# Patient Record
Sex: Male | Born: 1940
Health system: Southern US, Community
[De-identification: ages and names within clinical notes are randomized; demographics above are authoritative.]

## PROBLEM LIST (undated history)

## (undated) DIAGNOSIS — I2510 Atherosclerotic heart disease of native coronary artery without angina pectoris: Secondary | ICD-10-CM

---

## 2007-11-20 DIAGNOSIS — I2510 Atherosclerotic heart disease of native coronary artery without angina pectoris: Secondary | ICD-10-CM

## 2007-11-20 HISTORY — PX: CORONARY ARTERY BYPASS GRAFT: SHX141

## 2007-11-20 HISTORY — DX: Atherosclerotic heart disease of native coronary artery without angina pectoris: I25.10

## 2008-05-01 ENCOUNTER — Inpatient Hospital Stay (HOSPITAL_COMMUNITY): Admission: EM | Admit: 2008-05-01 | Discharge: 2008-05-09 | Payer: Self-pay | Admitting: Emergency Medicine

## 2008-05-03 ENCOUNTER — Encounter: Admission: RE | Admit: 2008-05-03 | Discharge: 2008-05-03 | Payer: Self-pay | Admitting: Surgery

## 2008-05-03 ENCOUNTER — Encounter (INDEPENDENT_AMBULATORY_CARE_PROVIDER_SITE_OTHER): Payer: Self-pay | Admitting: Internal Medicine

## 2008-05-03 HISTORY — PX: TRANSTHORACIC ECHOCARDIOGRAM: SHX275

## 2008-05-04 ENCOUNTER — Encounter (INDEPENDENT_AMBULATORY_CARE_PROVIDER_SITE_OTHER): Payer: Self-pay | Admitting: Cardiovascular Disease

## 2008-05-04 HISTORY — PX: OTHER SURGICAL HISTORY: SHX169

## 2008-05-11 ENCOUNTER — Ambulatory Visit: Payer: Self-pay | Admitting: Surgery

## 2008-05-14 ENCOUNTER — Ambulatory Visit: Payer: Self-pay | Admitting: Thoracic Surgery (Cardiothoracic Vascular Surgery)

## 2008-05-25 ENCOUNTER — Encounter: Admission: RE | Admit: 2008-05-25 | Discharge: 2008-05-25 | Payer: Self-pay | Admitting: Surgery

## 2008-05-25 ENCOUNTER — Ambulatory Visit: Payer: Self-pay | Admitting: Surgery

## 2008-05-27 ENCOUNTER — Encounter (HOSPITAL_COMMUNITY): Admission: RE | Admit: 2008-05-27 | Discharge: 2008-08-25 | Payer: Self-pay | Admitting: Cardiology

## 2008-06-01 ENCOUNTER — Ambulatory Visit: Payer: Self-pay | Admitting: Cardiology

## 2008-08-24 ENCOUNTER — Ambulatory Visit: Payer: Self-pay | Admitting: Cardiology

## 2008-08-26 ENCOUNTER — Encounter (HOSPITAL_COMMUNITY): Admission: RE | Admit: 2008-08-26 | Discharge: 2008-10-28 | Payer: Self-pay | Admitting: Cardiology

## 2008-12-21 ENCOUNTER — Ambulatory Visit: Payer: Self-pay | Admitting: Cardiology

## 2009-04-28 ENCOUNTER — Ambulatory Visit: Payer: Self-pay | Admitting: Cardiology

## 2009-08-24 HISTORY — PX: NM MYOCAR PERF EJECTION FRACTION: HXRAD630

## 2009-08-24 HISTORY — PX: TRANSTHORACIC ECHOCARDIOGRAM: SHX275

## 2009-10-21 ENCOUNTER — Encounter (INDEPENDENT_AMBULATORY_CARE_PROVIDER_SITE_OTHER): Payer: Self-pay | Admitting: *Deleted

## 2009-10-27 ENCOUNTER — Ambulatory Visit: Payer: Self-pay | Admitting: Cardiology

## 2009-12-26 ENCOUNTER — Ambulatory Visit: Payer: Self-pay | Admitting: Cardiology

## 2010-02-14 ENCOUNTER — Encounter (INDEPENDENT_AMBULATORY_CARE_PROVIDER_SITE_OTHER): Payer: Self-pay | Admitting: *Deleted

## 2010-12-21 NOTE — Miscellaneous (Signed)
  Clinical Lists Changes  Observations: Added new observation of RS STUDY: TRACER - study completion 12/26/09 (02/14/2010 11:41)      Research Study Name: TRACER - study completion 12/26/09

## 2010-12-21 NOTE — Miscellaneous (Signed)
  Clinical Lists Changes  Observations: Added new observation of RS STUDY: TRACER (10/21/2009 12:55)      Research Study Name: TRACER

## 2011-04-03 NOTE — Op Note (Signed)
NAME:  Carlos [name], SCIANDRA NO.:  [id number]   MEDICAL RECORD NO.:  [id number]          PATIENT TYPE:  INP   LOCATION:  2313                         FACILITY:  [facility]   PHYSICIAN:  Evelene Croon, M.D.     DATE OF BIRTH:  03/03/1941   DATE OF PROCEDURE:  05/05/2008  DATE OF DISCHARGE:                               OPERATIVE REPORT   PREOPERATIVE DIAGNOSIS:  Left main and severe three-vessel coronary  disease.   POSTOPERATIVE DIAGNOSIS:  Left main and severe three-vessel coronary  disease.   OPERATIVE PROCEDURE:  Median sternotomy, extracorporeal circulation,  coronary bypass graft surgery x5 using a left internal mammary artery  graft to left anterior descending coronary artery, with a saphenous vein  graft to the second diagonal branch of the left anterior descending, a  sequential saphenous vein graft to the first and second obtuse marginal  branches of the left circumflex coronary artery, and a saphenous vein  graft to the posterior descending branch of the right coronary,  endoscopic vein harvesting from both thighs.   ATTENDING SURGEON:  Evelene Croon, MD   ASSISTANT:  Doree Fudge, PA   ANESTHESIA:  General endotracheal.   CLINICAL HISTORY:  This patient is a 70 year old gentleman with a strong  family history of heart disease who was admitted with chest pain and  ruled in for myocardial infarction with a peak CPK of 840 and MB of  100.3.  Troponin-I was 16.55.  Cardiac catheterization revealed diffuse  70% left main narrowing.  The LAD had 99% proximal stenosis before a  second diagonal branch.  The second diagonal itself had significant  proximal stenosis.  The first diagonal was relatively small.  Left  circumflex had 80% ostial stenosis.  There were 2 marginal branches, the  first which had 99% ostial stenosis and the second had 90% ostial  stenosis.  The right coronary artery had 60% ostial stenosis, 70-80%  proximal stenosis and 40% mid stenosis.   Left ventricular ejection  fraction was about 60%.  There is no mitral regurgitation and no  gradient across the aortic valve.  After review of the angiogram and  examination of the patient, it was felt that coronary bypass graft  surgery is the best treatment to prevent further ischemia and  infarction.  I discussed the operative procedure with the patient and  his wife including alternatives, benefits, and risks including, but not  limited to bleeding, blood transfusion, infection, stroke, myocardial  infarction, graft failure, and death.  They understood and agreed to  proceed.   OPERATIVE PROCEDURE:  The patient was taken to the operating room and  placed on table in supine position.  After induction of general  endotracheal anesthesia, a Foley catheter was placed in bladder using  sterile technique.  Then the chest, abdomen, and both lower extremities  were prepped and draped in usual sterile manner.  The chest was entered  through a median sternotomy incision.  The pericardium opened in  midline.  Examination of the heart showed good ventricular  contractility.  The ascending aorta had no palpable plaques in it.  Then the left internal mammary artery was harvested from the chest wall  and pedicle graft.  This is a medium caliber vessel with excellent blood  flow through it.  At same time segment of greater saphenous vein was  harvested from the right leg using endoscopic vein harvest technique.  This vein was of medium size and good quality.  Below the knee, this  vein divided into 3 small sub branches neither which was suitable.  Therefore another section of vein was harvested from the left thigh  using endoscopic vein harvest technique.  This vein was of small to  medium caliber and somewhat thick-walled and less ideal quality than the  right-sided vein.   Then the  patient was heparinized and when an adequate activated  clotting time was achieved, the distal ascending aorta  was cannulated  using a 20-French aortic cannula for arterial inflow.  Venous outflow  was achieved using a 2-stage venous cannula for the right atrial  appendage.  An antegrade cardioplegia and vent cannula was inserted in  aortic root.   The patient placed on cardiopulmonary bypass and distal coronaries  identified.  The LAD was a large graftable vessel.  The mid part of the  vessel had some plaque in it.  The first diagonal branch was a small non  graftable vessel.  It was diffusely diseased.  The second diagonal  branch was a moderate-sized vessel that was also fairly heavily  diseased, but graftable distally.  The 2 marginal branches were both  graftable.  The first was larger and was epicardial while the second was  intramyocardial.  The right coronary was diffusely diseased with  calcific plaque in this extended out into the takeoff of the posterior  descending branch.  Beyond this, the posterior descending was a medium  size graftable vessel.   Then the aorta was crossclamped and 1000 mL of cold blood antegrade  cardioplegia was administered in the aortic root with quick arrest of  the heart.  Systemic hypothermia to 20 degrees centigrade and topical  hypothermic iced saline was used.  A temperature probe placed in septum  and insulating pad in the pericardium.   The first distal anastomosis was performed to the first marginal branch.  The internal diameter was about 1.75 mm.  Conduit used was a segment of  greater saphenous vein.  The anastomosis formed was a sequential side-to-  side manner using continuous 7-0 Prolene suture.  Flow was noted through  the graft was excellent.   The second distal anastomosis was performed to the second marginal  branch.  The internal diameter was about 1.6 mm.  Conduit used was the  same segment of greater saphenous vein the anastomosis formed a  sequential end-to-side manner using continuous 7-0 Prolene suture.  Flow  was noted through the  graft and was excellent.  Then another dose of  cardioplegia was given down the vein graft and the aortic root.   Third distal anastomosis was performed in the second diagonal branch.  The internal diameter was about 1.6 mm.  Conduit used was a second  segment of greater saphenous vein and anastomosis formed end-to-side  manner using continuous 7-0 Prolene suture.  Flow was noted through the  graft was excellent.   The fourth distal anastomosis was performed to the posterior descending  coronary artery.  The internal diameter of this vessel was about 1.75  mm.  Conduit used was a third segment of greater saphenous vein.  The  anastomosis  performed in a end-to-side manner using continuous 7-0  Prolene suture.  Flow was noted through the graft was excellent.   The fifth distal anastomosis was performed to distal LAD.  The internal  diameter was 2 mm.  The conduit used was a left internal mammary graft  and was brought through an opening left pericardium anterior to the  phrenic nerve.  It was anastomosed to the LAD in end-to-side manner  using continuous 8-0 Prolene suture.  The pedicle was sutured to the  epicardium with 6-0 Prolene sutures.  The patient rewarmed to 37 degrees  centigrade.  With the crossclamp in place, the 3 proximal vein graft  anastomoses were performed to the aortic root in an end-to-side manner  using continuous 6-0 Prolene suture.  Then the clamp removed from the  mammary pedicle.  There is rapid warming of the ventricular septum and  return of spontaneous ventricular fibrillation.  Crossclamp removed time  92 minutes and the patient spontaneously converted to sinus rhythm.  The  proximal and distal anastomoses appeared hemostatic while the grafts  satisfactory.  Graft markers placed around the proximal anastomoses.  Two temporary right ventricular and right atrial pacing wires placed  above the skin.   When the patient rewarmed to 37 degrees centigrade, he was  weaned from  cardiopulmonary bypass on no inotropic trophic agents.  Total bypass  time was 110 minutes.  Cardiac function appeared excellent with a  cardiac output of 7 liters per minute.  Protamine was given and venous  and aortic cannulas were removed without difficulty.  Hemostasis was  achieved.  The patient was given 1 unit of platelets due to platelet  count 75,000.  Then 3 chest tubes were placed with 2 in the post  pericardium, 1 in the anterior mediastinum,  and 1 in the left pleural  space.  The sternum was then closed with double #6 stainless steel  wires.  The fascia was closed with continuous #1 Vicryl suture.  Subcutaneous tissue was closed with continuous 2-0 Vicryl and skin with  3-0 Vicryl subcuticular closure.  Lower extremity vein harvest site was  closed in layers in a similar manner.  The sponge, needle, and  instrument counts were correct according to scrub nurse.  Dry sterile  dressing was applied over the incisions  around the chest tubes with  Pleur-Evac suction.  The patient remained hemodynamically stable, and  transferred to the SICU in guarded, but stable condition.      Evelene Croon, M.D.  Electronically Signed     BB/MEDQ  D:  05/05/2008  T:  05/06/2008  Job:  [id number]   cc:   Cristy Hilts. Jacinto Halim, MD

## 2011-04-03 NOTE — H&P (Signed)
NAME:  Carlos [name], LANGLAND NO.:  [id number]   MEDICAL RECORD NO.:  [id number]          PATIENT TYPE:  INP   LOCATION:  3701                         FACILITY:  [facility]   PHYSICIAN:  Lonia Blood, M.D.      DATE OF BIRTH:  May 01, 1941   DATE OF ADMISSION:  05/01/2008  DATE OF DISCHARGE:                              HISTORY & PHYSICAL   PRIMARY CARE PHYSICIAN:  The patient is unassigned.   PRESENTING COMPLAINT:  Left-sided chest pain.   HISTORY OF PRESENT ILLNESS:  The patient is a 70 year old gentleman with  no significant past medical history, brought in by his wife secondary to  onset of chest pain today.  Chest pain was said to be in the left side  and made discomfort.  It came suddenly and then radiated towards his  left axilla.  It was described as a dull pain 6-7/10.  No associated  diaphoresis.  No shortness of breath.  No fever.  No cough.  Denied any  near syncope, palpitations, or syncope.  The patient has risk factors of  coronary artery disease including some family history, remote tobacco  use, male sex, and unknown cholesterol status.   PAST MEDICAL HISTORY:  None.   MEDICATIONS:  None   ALLERGIES:  No known drug allergies.   REVIEW OF SYSTEMS:  Twelve-point review of system is negative except as  per HPI.   PHYSICAL EXAMINATION:  VITAL SIGNS:  Temperature is 96.7, blood pressure  104/75, pulse 59, and respiratory rate 16.  His sats 95% on room air.  GENERAL:  He is awake, alert, oriented, and pleasant man, in no acute  distress.  HEENT:  PERRL.  EOMI.  NECK:  Supple.  No JVD.  No lymphadenopathy.  RESPIRATORY:  The patient has good air entry bilaterally.  No wheezes.  No rales.  CARDIOVASCULAR SYSTEM:  He has S1 and S2.  No murmurs.  ABDOMEN:  Soft and nontender with positive bowel sounds.  EXTREMITIES:  No edema, cyanosis, or clubbing.   LABORATORY DATA:  Labs showed a white count of 6.9, hemoglobin 14.2, and  platelet count 174.  Sodium 139,  potassium 3.4, chloride 106, CO2 24,  glucose 115, BUN 14, and creatinine 0.99.  Chest x-ray showed linear  lingular scarring or atelectasis.  His EKG showed sinus bradycardia with  a rate of 52.  PR interval is regular with no ST-T wave changes.   ASSESSMENT:  Therefore, this is a 70 year old gentleman presenting with  what appeared to be left-sided chest pain.  The differentials here could  be acute coronary syndrome.  Could be pericarditis.  Could be some viral  illness or musculoskeletal in nature.  It could also be some elements of  acid reflux, although the patient denied any symptoms of such.   PLAN:  Therefore will be:  1. Chest pain.  We will admit the patient to tele bed for rule out MI      as an observation.  We will check serial cardiac enzymes and if      they are negative, we will set up the  patient to have an outpatient      stress test.  2. Hypokalemia.  We will give the patient potassium as a form of      replacement.  3. Unknown cholesterol status.  We will check TSH, fasting lipid      panel, and hemoglobin A1c.  We will also check urine drug screen      for further management.  We will also check 2-D echo and give him      some nitro as needed.  Otherwise, further treatment will depend on      the patient's initial response to treatment.      Lonia Blood, M.D.  Electronically Signed     LG/MEDQ  D:  05/01/2008  T:  05/01/2008  Job:  [id number]

## 2011-04-03 NOTE — Assessment & Plan Note (Signed)
OFFICE VISIT   JUWAN, Carlos [name]  DOB:  May 03, 1941                                        May 25, 2008  CHART #:  [id number]   HISTORY:  The patient returned today for a followup status post coronary  artery bypass graft surgery x5 on May 05, 2008.  His immediate  postoperative course was uncomplicated.  He did return to see Dr.  Orson Aloe on May 14, 2008, due to some early cellulitis in his right  leg saphenous vein harvest incision.  He was started on Keflex 500 mg  p.o. t.i.d. and this has resolved.  He has had no pain and denies any  shortness of breath.   He has been walking about 2 miles per day and is anxious to return to  work part-time.   PHYSICAL EXAMINATION:  VITAL SIGNS:  His blood pressure is 117/79 and  his pulse is 87 and regular.  Respiratory rate is 18 and unlabored.  Oxygen saturation on room air is 96%.  GENERAL:  He looks well.  CARDIAC:  Regular rate and rhythm with normal heart sounds.  LUNGS:  Clear.  CHEST:  The chest incision is healing well and the sternum is stable.  EXTREMITIES:  His right leg vein harvest incision is intact.  There is  some firmness and induration around the incision suggesting some  hematoma within the vein harvest tunnel.  There is no sign of infection  at this point.  There is no peripheral edema.  The left leg vein harvest  incision is healing well.   IMAGING STUDIES:  Followup chest x-ray shows mild left basilar  atelectasis and a tiny amount of pleural fluid.   MEDICATIONS:  1. Toprol-XL 25 mg daily.  2. Lisinopril 2.5 mg daily.  3. Crestor 40 mg nightly.  4. Aspirin 325 mg daily.   IMPRESSION:  Overall, the patient is recovering well following his  surgery.  The right leg vein harvest incision cellulitis appears to have  resolved.  I would expect the induration to gradually resolve.  He feels  well overall.  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  I told him he could return to work part-  time, when he feels up to that.  He will continue to follow up with Dr.  Jacinto Halim and will contact me if he develops any problems with his incision.   Evelene Croon, M.D.  Electronically Signed   BB/MEDQ  D:  05/25/2008  T:  05/26/2008  Job:  [id number]   cc:   Cristy Hilts. Jacinto Halim, MD

## 2011-04-03 NOTE — Consult Note (Signed)
NAME:  Carlos [name], Carlos [name] NO.:  [id number]   MEDICAL RECORD NO.:  [id number]          PATIENT TYPE:  INP   LOCATION:  2907                         FACILITY:  [facility]   PHYSICIAN:  Evelene Croon, M.D.     DATE OF BIRTH:  [birth year]   DATE OF CONSULTATION:  DATE OF DISCHARGE:                                 CONSULTATION   REFERRING PHYSICIAN:  Vonna Kotyk R. Jacinto Halim, MD   REASON FOR CONSULTATION:  Severe three-vessel coronary artery disease  status post non-ST-segment elevation MI.   CLINICAL HISTORY:  I was asked by Dr. Jacinto Halim to evaluate Carlos [name] for  coronary artery bypass graft surgery.  He is a 70 year old gentleman  with a strong family history of heart disease, as well as a remote  history of smoking who presented with about a 2-week history of  intermittent substernal chest discomfort which he described as a burning  sensation.  This has been associated with dyspnea on exertion.  He  thought this was heartburn or ingestion and ultimately resolved.  He was  awoken about midnight on April 30, 2008, with chest discomfort which was  the same burning sensation radiating to the left arm.  He called EMS and  was brought to [facility] Emergency Room.  On oxygen, his chest pain  slightly improved but he said his left arm discomfort persisted for  several hours.  His initial cardiac markers were elevated with a  troponin of 0.93 and a CPK of 316 and an MB of 30.7.  His CPK peaked at  840 with an MB of 100.3.  His peak troponin was 16.5.  He remained  stable and underwent cardiac catheterization yesterday which showed  severe three-vessel disease.  The LAD had 95-99% proximal stenosis.  There are two diagonal branches.  The left main had about 60% diffuse  narrowing.  Left circumflex had 80% proximal stenosis.  There were two  marginal branches one of which had 90% proximal stenosis and the other  85% proximal stenosis.  The right coronary artery was also diffusely  diseased with  60% ostia, 80% proximal and 40% midvessel disease.  Left  ventricular function was normal.  There is no mitral regurgitation.   REVIEW OF SYSTEMS:  GENERAL:  He denies any fever or chills.  He has had  no recent weight changes.  He does report some fatigue.  EYES:  Negative.  ENT:  Negative.  ENDOCRINE:  He denies diabetes and  hypothyroidism.  CARDIOVASCULAR:  As above.  He denies PND and  orthopnea.  He has had no peripheral edema.  Denies palpitations.  RESPIRATORY:  Denies cough and sputum production.  GI:  He has had no  nausea, vomiting.  Denies melena and bright red blood per rectum.  GU:  He denies dysuria and hematuria.  MUSCULOSKELETAL:  Denies arthralgias  and myalgias.  NEUROLOGICAL:  Denies any focal weakness or numbness.  Denies dizziness and syncope.  He has never had a TIA or stroke.  ALLERGIES:  None.  PSYCHIATRIC:  Negative.  HEMATOLOGICAL:  Negative.  VASCULAR:  He denies claudication or phlebitis.  PAST MEDICAL HISTORY:  1. Remote tonsillectomy  2. Denies any history of hypertension or diabetes.  3. Denies hyperlipidemia.   He has not seen a physician in many years.   MEDICATIONS:  None.   ALLERGIES:  None.   SOCIAL HISTORY:  He is married and lives with his wife.  He quit smoking  about 6-7 years ago with about 10-12 pack year smoking history.  He  continues to work full-time for [employer].   FAMILY HISTORY:  Strongly positive for premature coronary artery  disease.  His son had a large myocardial infarction at age 21 and is  present at 41.   PHYSICAL EXAMINATION:  VITAL SIGNS:  His blood pressure is 120/73, and  pulse is 63 and regular.  Respiratory rate is 16 unlabored.  GENERAL:  He is a well-developed obese white male in no distress.  HEENT:  Shows him to be normocephalic and atraumatic.  Pupils are equal  and reactive to light and accommodation.  His extraocular muscles are  intact.  His throat is clear.  NECK:  Shows normal carotid pulses bilaterally.   There are no bruits.  There is no adenopathy or thyromegaly.  CARDIAC:  Shows a regular rate and rhythm with normal S1-S2.  There is  no murmur, rub or gallop.  LUNGS:  Clear.  ABDOMEN:  Shows active bowel sounds.  His abdomen is soft, obese and  nontender.  There are no palpable masses or organomegaly.  EXTREMITY:  Shows no peripheral edema.  Pedal pulses are palpable  bilaterally.  SKIN:  Warm and dry.  NEUROLOGIC:  Shows him to be alert and oriented x3.  Motor and sensory  exams are grossly normal.   Electrocardiogram shows normal sinus rhythm with anterior T-wave  abnormality.  Carotid Doppler examination is pending.  Hemoglobin was  14.2 on admission, platelet count 174,000.  Electrolytes were normal  with a BUN of 14 and creatinine of 0.99.   IMPRESSION:  Carlos [name] has severe three-vessel coronary artery disease  status post acute non-ST segment elevation MI.  I agree that coronary  artery bypass graft surgery is best treatment to prevent further  ischemia, and  infarction.  He was started on the tracer study and  received a loading dose prior to his catheterization.  He is currently  on Integrilin.  I do not think he should be continued on the tracer  study since he is going to require coronary bypass surgery in the  morning.  We will plan to stop his Integrilin 6 hours preoperatively.  I  discussed the operative procedure of coronary bypass surgery with him  including alternatives, benefits, and risks including but not limited to  bleeding, blood transfusion, infection, stroke, myocardial infarction,  graft failure, and death.  He understands and agrees to proceed.      Evelene Croon, M.D.  Electronically Signed     BB/MEDQ  D:  05/04/2008  T:  05/05/2008  Job:  [id number]   cc:   Cristy Hilts. Jacinto Halim, MD

## 2011-04-03 NOTE — Assessment & Plan Note (Signed)
OFFICE VISIT   Carlos [name], Carlos [name]  DOB:  October 29, 1941                                        May 14, 2008  CHART #:  [id number]   The patient is a 70 year old gentleman who had coronary bypass grafting  by Dr. Laneta Simmers on May 05, 2008.  His postoperative course was  uncomplicated.  This morning, he noticed that on his right leg where the  saphenous vein was harvested, there is an incision above the knee that  was more swollen and red and warm.  He has not had any fevers, chills,  or sweats.  He has not had any calf swelling or tenderness.   PHYSICAL EXAMINATION:  VITAL SIGNS:  Blood pressure is 107/69, pulse 88,  respirations were 18, and his oxygen saturation is 95% on room air.  GENERAL:  He is a 70 year old white male in no acute distress.  He is  afebrile.  CHEST:  The sternal incision, chest tube sites, and right leg incisions  are all healing well.  The incision just below the knee on the right  leg, there is seroma.  There is also redness, induration, and warmth and  very slight tenderness consistent with early cellulitis.   IMPRESSION:  Early cellulitis, right leg wound.  We will start on Keflex  500 mg p.o. t.i.d.  The patient was instructed to call back if there is  any fevers greater than 101.5 or increase in the redness or tenderness  or any purulent drainage.  We will have him come back to see Dr. Laneta Simmers  next week.   Salvatore Decent Dorris Fetch, M.D.  Electronically Signed   SCH/MEDQ  D:  05/14/2008  T:  05/15/2008  Job:  [id number]

## 2011-04-03 NOTE — Consult Note (Signed)
NAME:  QASIM, DIVELEY NO.:  [id number]   MEDICAL RECORD NO.:  [id number]          PATIENT TYPE:  INP   LOCATION:  3701                         FACILITY:  [facility]   PHYSICIAN:  Cristy Hilts. Jacinto Halim, MD       DATE OF BIRTH:  [birth year]   DATE OF CONSULTATION:  05/01/2008  DATE OF DISCHARGE:                                 CONSULTATION   REFERRING PHYSICIAN:  Dr. Sherrilee Gilles.   REASON FOR CONSULTATION:  Please evaluate for non-Q-wave myocardial  infarction and unstable angina.   CHIEF COMPLAINT:  Mr. Rainville is a 70 year old pleasant gentleman who  had been complaining of chest pain that started last night around 11:30  to 12 o'clock.  This chest discomfort was in the form of burning  sensation with radiation to his left arm and hence immediately within 10  minutes of waiting, he called the EMS, and he was brought to the [facility] Emergency Room.  After placing him, the patient on oxygen, his  chest discomfort had significantly improved; however, left arm pain  discomfort was still persistent for about 2-3 hours.  His cardiac  markers were positive for myocardial infarction hence I was called for  evaluation.  Presently, he is completely asymptomatic.   On further questioning, the patient state that he has been noticing  chest tightness in the form of burning sensation in the chest associated  with dyspnea on exertion for the last 2-3 weeks.  He attributed this to  indigestion and moved on.   REVIEW OF SYSTEMS:  He denies any bowel or bladder dysfunction.  Denies  neurological weakness.  Denies recent weight gain or weight loss.  Other  systems are negative.   HOME MEDICATIONS:  None.   ALLERGIES:  No known drug allergies.   PAST MEDICAL HISTORY:  No history of hypertension and diabetes.  He does  not know anything about his lipids, not seen a physician in many years.   PAST SURGICAL HISTORY:  Remote appendectomy.   SOCIAL HISTORY:  He is married, lives with his  wife, quit smoking about  6-7 years ago.  He has approximately about 10-12 pack year history of  smoking in the past.   FAMILY HISTORY:  Strongly positive for premature coronary artery  disease.  His son had myocardial infarction at the age of 45.  He is  presently 70 years of age.  He has a daughter who is 22 years of age  alive and healthy.  He has a sister who is 32 years of age alive and  healthy and half-sister who is in 58s alive and healthy.   PHYSICAL EXAMINATION:  GENERAL:  He is well-built who appears to be in  no acute distress.  VITAL SIGNS:  Temperature of 98.7, pulse is 68 beats per minute,  respiration is 14, and blood pressure 128/83 mmHg.  CARDIAC:  S1 and S2 normal.  There is no gallop or murmur.  CHEST:  Bilaterally clear breath sounds.  No crackles.  ABDOMEN:  Soft.  EXTREMITIES:  No edema.  Peripheral vascular exam was normal.  LABORATORY DATA:  His CBC was within normal limits.  BMP was within  normal limits except for potassium of 3.4, glucose was minimally  elevated at 115, and his cardiac markers were clearly positive for  myocardial injury with a total CK of 316 and MB of 30.7.  Troponin of  0.93 at 4:30 this morning.   His admission EKG in the emergency room reveals normal sinus rhythm and  there is suggestion of minimal ST elevation or hyperacute T-waves  anteriorly.  However, repeat EKG done just a few minutes ago at 11:23  reveals completely normal sinus rhythm without any evidence of ischemia.   IMPRESSION:  1. Non-ST elevation myocardial infarction.  2. Unknown lipid status.  3. Strong family history of premature coronary artery disease.   RECOMMENDATIONS:  I discussed to the patient regarding proceeding with  cardiac catheterization on elective fashion.  He understands less than  1% risk or death stroke, heart attack, and need for urgent bypass  surgery with cardiac catheterization on 2-3% with PCI.  I am going to  start him on instead of  metoprolol, I will start him on Coreg and also  Coreg 3.125 mg p.o. b.i.d.  Add Crestor 40 mg p.o. daily and Integrilin  for pharmacy.  Unless, he develops recurrent chest pain or any EKG  changes, I will take him to the catheterization lab on a urgent basis.   I thank Dr. Sherrilee Gilles for asking me to see this pleasant gentleman.      Cristy Hilts. Jacinto Halim, MD  Electronically Signed     JRG/MEDQ  D:  05/01/2008  T:  05/02/2008  Job:  [id number]

## 2011-04-03 NOTE — Discharge Summary (Signed)
NAME:  Carlos [name], Carlos [name] NO.:  [id number]   MEDICAL RECORD NO.:  [id number]          PATIENT TYPE:  INP   LOCATION:  2012                         FACILITY:  [facility]   PHYSICIAN:  Evelene Croon, M.D.     DATE OF BIRTH:  11-30-1940   DATE OF ADMISSION:  05/01/2008  DATE OF DISCHARGE:  05/09/2008                               DISCHARGE SUMMARY   FINAL DIAGNOSIS:  Left main and severe three-vessel coronary artery  disease.   IN-HOSPITAL DIAGNOSES:  1. Low volume overload postoperatively.  2. Acute blood loss anemia postoperatively.   SECONDARY DIAGNOSES:  1. Status post tonsillectomy.  2. History of tobacco use, but he quit smoking about 6-7 years ago.   IN-HOSPITAL OPERATIONS AND PROCEDURES:  1. Cardiac catheterization.  2. Coronary artery bypass grafting x5 using left internal mammary      artery graft to left anterior descending coronary artery, saphenous      vein graft to second diagonal branch of left anterior descending,      sequential saphenous vein graft to first and second obtuse marginal      branches of the left circumflex coronary artery, and saphenous vein      graft to posterior descending branch of the right coronary artery.      Endoscopic vein harvest from both thighs was done.   HISTORY AND PHYSICAL AND HOSPITAL COURSE:  The patient is a 70 year old  gentleman with a strong family history of heart disease, who was  admitted with chest pain and ruled in for myocardial infarction with a  peak CPK of 840 and MB of 100.3.  Troponin I was elevated at 16.55.  The  patient was taken for cardiac catheterization, which revealed severe  three-vessel coronary artery disease.  Following catheterization, Dr.  Laneta Simmers was consulted.  Dr. Laneta Simmers discussed with the patient undergoing  coronary artery bypass grafting.  He discussed risks and benefits with  the patient.  The patient acknowledged understanding and agreed to  proceed.  Surgery was scheduled for May 05, 2008.  Preoperatively, the  patient underwent bilateral carotid duplex ultrasound showing no  significant ICA stenosis.  The patient remained stable preoperatively.   The patient was taken to the operating room on May 05, 2008, where he  underwent coronary artery bypass grafting x5 using a left internal  mammary artery graft to left anterior descending coronary artery,  saphenous vein graft to second diagonal branch of the left anterior  descending, sequential saphenous vein graft to first and second obtuse  marginal branches of the left circumflex coronary artery, and saphenous  vein graft to posterior descending branch of the right coronary artery.  Endoscopic vein harvesting from bilateral thighs was done.  The patient  tolerated this procedure well and was transferred to the Intensive Care  Unit in stable condition.  Postoperatively, the patient was noted to be  hemodynamically stable.  He was extubated on evening of surgery.  Post-  extubation, he was noted to be alert and oriented x4.  Neuro intact.  The patient's postoperative course was pretty much unremarkable.  Chest  x-ray  done postop day 1 was stable.  The patient had minimal drainage  from chest tubes and chest tubes discontinued in normal fashion.  The  patient was able to be weaned off all drips and blood pressure and heart  rate are stable following.  He was noted to be in normal sinus rhythm  postoperatively.  The patient was started on low-dose beta-blocker.  He  was progressing well in the Intensive Care Unit and felt to be stable  for transfer out to 2000 on postop day 2.  The patient continued to work  on his incentive spirometer and he was able to be weaned off oxygen  sating greater than 90% on room air.  He remained in normal sinus  rhythm.  A low-dose ACE inhibitor was added for a blood pressure  control.  Blood pressure did improve.  The patient did have mild acute  blood loss anemia postoperatively.  He was  asymptomatic and started on  p.o. iron.  The patient did not require any transfusions  postoperatively.  The patient was with some mild volume overload and  eventually was started on diuretics.  Daily weights were obtained.  His  volume overload was improving and prior to discharge was 3.4 kg above  his preoperative weight.  Postoperatively, Cardiac Rehab continued to  work with the patient.  He was out of bed, ambulating well without  difficulty.  He was tolerating diet well.  No nausea or vomiting noted.  Preoperatively, the patient was placed on the TRACER study.  He was  continued on the TRACER study postoperatively and a research nurse did  see the patient prior to discharge and spent TRACER medicine the patient  plus written and verbal instructions.  They discussed and the patient  will need to call office for refill of medications.  The patient  acknowledged his understanding the research nurse.   On postop day 4, May 09, 2008, the patient was noted to be afebrile.  He was in normal sinus rhythm.  Blood pressure 125/67 and O2 sat 96% on  room air.  Again, he was 3.4 kg above his preoperative weight.  All  incisions were clean, dry, and intact and healing well.  The patient was  felt to be stable and ready for discharge home on postop day 4.   FOLLOWUP APPOINTMENTS:  A followup appointment was arranged with Dr.  Laneta Simmers for May 25, 2008, at 12:15 p.m.  The patient will need to obtain  PA, lateral chest x-ray 30 minutes prior to this appointment.  The  patient needs to follow up with Dr. Jacinto Halim in 2 weeks.  He will need to  contact Dr. Verl Dicker office to make these arrangements.   ACTIVITY:  The patient was instructed no driving until released to do  so, no heavy lifting over 10 pounds.  He is told to ambulate 3-4 times  per day, progress as tolerated, and continue his breathing exercises.   INCISIONAL CARE:  The patient is told to shower, wash his incisions  using soap and water.   He is to contact the office if he develops any  drainage or opening from any of his incision sites.   DIET:  The patient educated on diet to be low fat, low salt.   DISCHARGE MEDICATIONS:  1. Aspirin 325 mg daily.  2. Toprol-XL 25 mg daily.  3. Lisinopril 2.5 mg daily.  4. Crestor 40 mg at night.  5. Lasix 40 mg daily x5 days.  6.  Potassium chloride 20 mEq daily x5 days.  7. Oxycodone 5 mg 1-2 tabs q.4-6 h. p.r.n.  8. TRACER study  medication 1 tablet daily.       Theda Belfast, [place]      Evelene Croon, M.D.  Electronically Signed    KMD/MEDQ  D:  06/08/2008  T:  06/09/2008  Job:  [id number]   cc:   Cristy Hilts. Jacinto Halim, MD

## 2011-08-16 LAB — URINALYSIS, ROUTINE W REFLEX MICROSCOPIC
Bilirubin Urine: NEGATIVE
Glucose, UA: NEGATIVE
Hgb urine dipstick: NEGATIVE
Ketones, ur: NEGATIVE
Nitrite: NEGATIVE
Protein, ur: NEGATIVE
Specific Gravity, Urine: 1.008
Urobilinogen, UA: 1
pH: 7.5

## 2011-08-16 LAB — POCT I-STAT 3, ART BLOOD GAS (G3+)
Acid-base deficit: 1
Acid-base deficit: 2
Bicarbonate: 22.6
Bicarbonate: 22.9
Bicarbonate: 23
Bicarbonate: 25.2 — ABNORMAL HIGH
O2 Saturation: 100
O2 Saturation: 100
O2 Saturation: 100
O2 Saturation: 98
Operator id: 136891
Operator id: 274841
Operator id: 3342
Operator id: 3342
Patient temperature: 37.4
TCO2: 24
TCO2: 24
TCO2: 24
TCO2: 27
pCO2 arterial: 30.3 — ABNORMAL LOW
pCO2 arterial: 31.3 — ABNORMAL LOW
pCO2 arterial: 39.5
pCO2 arterial: 51.6 — ABNORMAL HIGH
pH, Arterial: 7.297 — ABNORMAL LOW
pH, Arterial: 7.372
pH, Arterial: 7.475 — ABNORMAL HIGH
pH, Arterial: 7.482 — ABNORMAL HIGH
pO2, Arterial: 114 — ABNORMAL HIGH
pO2, Arterial: 157 — ABNORMAL HIGH
pO2, Arterial: 287 — ABNORMAL HIGH
pO2, Arterial: 340 — ABNORMAL HIGH

## 2011-08-16 LAB — CARDIAC PANEL(CRET KIN+CKTOT+MB+TROPI)
CK, MB: 100.3 — ABNORMAL HIGH
CK, MB: 19.7 — ABNORMAL HIGH
CK, MB: 39.1 — ABNORMAL HIGH
CK, MB: 86.3 — ABNORMAL HIGH
CK, MB: 15.9 — ABNORMAL HIGH
CK, MB: 2.4
CK, MB: 22.9 — ABNORMAL HIGH
CK, MB: 38 — ABNORMAL HIGH
CK, MB: 5.2 — ABNORMAL HIGH
CK, MB: 5.2 — ABNORMAL HIGH
CK, MB: 5.7 — ABNORMAL HIGH
Relative Index: 11.9 — ABNORMAL HIGH
Relative Index: 5.5 — ABNORMAL HIGH
Relative Index: 6.9 — ABNORMAL HIGH
Relative Index: 9.9 — ABNORMAL HIGH
Relative Index: 2
Relative Index: 3.1 — ABNORMAL HIGH
Relative Index: 3.5 — ABNORMAL HIGH
Relative Index: 4 — ABNORMAL HIGH
Relative Index: 4.2 — ABNORMAL HIGH
Relative Index: 5.8 — ABNORMAL HIGH
Relative Index: 8.9 — ABNORMAL HIGH
Total CK: 360 — ABNORMAL HIGH
Total CK: 563 — ABNORMAL HIGH
Total CK: 840 — ABNORMAL HIGH
Total CK: 873 — ABNORMAL HIGH
Total CK: 118
Total CK: 144
Total CK: 148
Total CK: 167
Total CK: 375 — ABNORMAL HIGH
Total CK: 392 — ABNORMAL HIGH
Total CK: 427 — ABNORMAL HIGH
Troponin I: 10.06
Troponin I: 16.55
Troponin I: 5.83
Troponin I: 8.01
Troponin I: 1.92
Troponin I: 2.72
Troponin I: 2.84
Troponin I: 3.53
Troponin I: 3.57
Troponin I: 4.15
Troponin I: 5.59

## 2011-08-16 LAB — BASIC METABOLIC PANEL
BUN: 11
BUN: 12
BUN: 14
BUN: 13
BUN: 21
BUN: 24 — ABNORMAL HIGH
BUN: 8
CO2: 24
CO2: 25
CO2: 25
CO2: 22
CO2: 25
CO2: 25
CO2: 26
Calcium: 8.2 — ABNORMAL LOW
Calcium: 8.5
Calcium: 8.7
Calcium: 7.4 — ABNORMAL LOW
Calcium: 7.6 — ABNORMAL LOW
Calcium: 7.7 — ABNORMAL LOW
Calcium: 8.5
Chloride: 103
Chloride: 106
Chloride: 106
Chloride: 104
Chloride: 105
Chloride: 110
Chloride: 113 — ABNORMAL HIGH
Creatinine, Ser: 0.99
Creatinine, Ser: 0.99
Creatinine, Ser: 1.01
Creatinine, Ser: 0.95
Creatinine, Ser: 0.97
Creatinine, Ser: 0.98
Creatinine, Ser: 1.03
GFR calc Af Amer: >60
GFR calc Af Amer: >60
GFR calc Af Amer: >60
GFR calc Af Amer: >60
GFR calc Af Amer: >60
GFR calc Af Amer: >60
GFR calc Af Amer: >60
GFR calc non Af Amer: >60
GFR calc non Af Amer: >60
GFR calc non Af Amer: >60
GFR calc non Af Amer: >60
GFR calc non Af Amer: >60
GFR calc non Af Amer: >60
GFR calc non Af Amer: >60
Glucose, Bld: 111 — ABNORMAL HIGH
Glucose, Bld: 112 — ABNORMAL HIGH
Glucose, Bld: 115 — ABNORMAL HIGH
Glucose, Bld: 101 — ABNORMAL HIGH
Glucose, Bld: 107 — ABNORMAL HIGH
Glucose, Bld: 121 — ABNORMAL HIGH
Glucose, Bld: 98
Potassium: 3.4 — ABNORMAL LOW
Potassium: 3.9
Potassium: 4
Potassium: 3.7
Potassium: 3.9
Potassium: 3.9
Potassium: 4
Sodium: 133 — ABNORMAL LOW
Sodium: 138
Sodium: 139
Sodium: 136
Sodium: 137
Sodium: 140
Sodium: 141

## 2011-08-16 LAB — COMPREHENSIVE METABOLIC PANEL
ALT: 32
AST: 31
Albumin: 3.3 — ABNORMAL LOW
Alkaline Phosphatase: 43
BUN: 9
CO2: 24
Calcium: 8.8
Chloride: 109
Creatinine, Ser: 1.01
GFR calc Af Amer: >60
GFR calc non Af Amer: >60
Glucose, Bld: 105 — ABNORMAL HIGH
Potassium: 3.7
Sodium: 141
Total Bilirubin: 0.9
Total Protein: 6.5

## 2011-08-16 LAB — CBC
HCT: 41
HCT: 41.4
HCT: 42.4
HCT: 44
HCT: 23.3 — ABNORMAL LOW
HCT: 23.5 — ABNORMAL LOW
HCT: 24.2 — ABNORMAL LOW
HCT: 24.6 — ABNORMAL LOW
HCT: 24.8 — ABNORMAL LOW
HCT: 27.2 — ABNORMAL LOW
HCT: 38.9 — ABNORMAL LOW
HCT: 40.6
Hemoglobin: 14
Hemoglobin: 14.2
Hemoglobin: 14.5
Hemoglobin: 15
Hemoglobin: 13.7
Hemoglobin: 14.5
Hemoglobin: 8 — ABNORMAL LOW
Hemoglobin: 8.4 — ABNORMAL LOW
Hemoglobin: 8.6 — ABNORMAL LOW
Hemoglobin: 8.6 — ABNORMAL LOW
Hemoglobin: 8.8 — ABNORMAL LOW
Hemoglobin: 9.5 — ABNORMAL LOW
MCHC: 34.1
MCHC: 34.2
MCHC: 34.3
MCHC: 34.3
MCHC: 34.5
MCHC: 34.9
MCHC: 35.1
MCHC: 35.3
MCHC: 35.3
MCHC: 35.5
MCHC: 35.5
MCHC: 35.7
MCV: 92.9
MCV: 93.3
MCV: 93.4
MCV: 93.9
MCV: 91.3
MCV: 92.4
MCV: 92.7
MCV: 93.1
MCV: 93.3
MCV: 93.4
MCV: 93.6
MCV: 93.8
Platelets: 159
Platelets: 165
Platelets: 168
Platelets: 174
Platelets: 110 — ABNORMAL LOW
Platelets: 160
Platelets: 161
Platelets: 69 — ABNORMAL LOW
Platelets: 69 — ABNORMAL LOW
Platelets: 70 — ABNORMAL LOW
Platelets: 90 — ABNORMAL LOW
RBC: 4.36
RBC: 4.44
RBC: 4.54
RBC: 4.73
RBC: 2.48 — ABNORMAL LOW
RBC: 2.51 — ABNORMAL LOW
RBC: 2.59 — ABNORMAL LOW
RBC: 2.64 — ABNORMAL LOW
RBC: 2.72 — ABNORMAL LOW
RBC: 2.94 — ABNORMAL LOW
RBC: 4.17 — ABNORMAL LOW
RBC: 4.4
RDW: 12.9
RDW: 12.9
RDW: 13.1
RDW: 13.2
RDW: 12.5
RDW: 12.6
RDW: 12.7
RDW: 12.8
RDW: 13.1
RDW: 13.1
RDW: 13.2
RDW: 13.2
WBC: 6.9
WBC: 7.9
WBC: 8.6
WBC: 9.3
WBC: 6.4
WBC: 6.7
WBC: 7.2
WBC: 7.3
WBC: 7.6
WBC: 7.9
WBC: 8
WBC: 9.1

## 2011-08-16 LAB — POCT I-STAT 7, (LYTES, BLD GAS, ICA,H+H)
Acid-Base Excess: 1
Acid-base deficit: 1
Bicarbonate: 26.4 — ABNORMAL HIGH
Bicarbonate: 27.2 — ABNORMAL HIGH
Calcium, Ion: 1.08 — ABNORMAL LOW
Calcium, Ion: 1.23
HCT: 29 — ABNORMAL LOW
HCT: 32 — ABNORMAL LOW
Hemoglobin: 10.9 — ABNORMAL LOW
Hemoglobin: 9.9 — ABNORMAL LOW
O2 Saturation: 100
O2 Saturation: 100
Operator id: 140821
Operator id: 140821
Patient temperature: 34.7
Patient temperature: 35.2
Potassium: 3.7
Potassium: 4.1
Sodium: 142
Sodium: 143
TCO2: 28
TCO2: 29
pCO2 arterial: 39.8
pCO2 arterial: 56.1 — ABNORMAL HIGH
pH, Arterial: 7.284 — ABNORMAL LOW
pH, Arterial: 7.42
pO2, Arterial: 394 — ABNORMAL HIGH
pO2, Arterial: 470 — ABNORMAL HIGH

## 2011-08-16 LAB — HEPARIN INDUCED THROMBOCYTOPENIA PNL
Heparin Induced Plt Ab: NEGATIVE
Patient O.D.: 0.207
Serotonin Release: 0 % release (ref <20)

## 2011-08-16 LAB — B-NATRIURETIC PEPTIDE (CONVERTED LAB): Pro B Natriuretic peptide (BNP): <30

## 2011-08-16 LAB — POCT I-STAT, CHEM 8
BUN: 12
BUN: 22
Calcium, Ion: 1.14
Calcium, Ion: 1.17
Chloride: 103
Chloride: 109
Creatinine, Ser: 1
Creatinine, Ser: 1
Glucose, Bld: 118 — ABNORMAL HIGH
Glucose, Bld: 128 — ABNORMAL HIGH
HCT: 24 — ABNORMAL LOW
HCT: 25 — ABNORMAL LOW
Hemoglobin: 8.2 — ABNORMAL LOW
Hemoglobin: 8.5 — ABNORMAL LOW
Potassium: 4
Potassium: 4.6
Sodium: 139
Sodium: 143
TCO2: 21
TCO2: 23

## 2011-08-16 LAB — DIFFERENTIAL
Basophils Absolute: 0
Basophils Absolute: 0
Basophils Absolute: 0
Basophils Relative: 0
Basophils Relative: 0
Basophils Relative: 0
Eosinophils Absolute: 0.3
Eosinophils Absolute: 0.3
Eosinophils Absolute: 0.3
Eosinophils Relative: 4
Eosinophils Relative: 4
Eosinophils Relative: 4
Lymphocytes Relative: 40
Lymphocytes Relative: 20
Lymphocytes Relative: 20
Lymphs Abs: 2.7
Lymphs Abs: 1.4
Lymphs Abs: 1.8
Monocytes Absolute: 0.9
Monocytes Absolute: 0.8
Monocytes Absolute: 0.9
Monocytes Relative: 12
Monocytes Relative: 10
Monocytes Relative: 11
Neutro Abs: 3
Neutro Abs: 4.7
Neutro Abs: 6
Neutrophils Relative %: 44
Neutrophils Relative %: 66
Neutrophils Relative %: 66

## 2011-08-16 LAB — POCT I-STAT 4, (NA,K, GLUC, HGB,HCT)
Glucose, Bld: 101 — ABNORMAL HIGH
Glucose, Bld: 102 — ABNORMAL HIGH
Glucose, Bld: 105 — ABNORMAL HIGH
Glucose, Bld: 110 — ABNORMAL HIGH
Glucose, Bld: 118 — ABNORMAL HIGH
Glucose, Bld: 126 — ABNORMAL HIGH
Glucose, Bld: 160 — ABNORMAL HIGH
HCT: 23 — ABNORMAL LOW
HCT: 26 — ABNORMAL LOW
HCT: 26 — ABNORMAL LOW
HCT: 26 — ABNORMAL LOW
HCT: 31 — ABNORMAL LOW
HCT: 35 — ABNORMAL LOW
HCT: 38 — ABNORMAL LOW
Hemoglobin: 10.5 — ABNORMAL LOW
Hemoglobin: 11.9 — ABNORMAL LOW
Hemoglobin: 12.9 — ABNORMAL LOW
Hemoglobin: 7.8 — CL
Hemoglobin: 8.8 — ABNORMAL LOW
Hemoglobin: 8.8 — ABNORMAL LOW
Hemoglobin: 8.8 — ABNORMAL LOW
Operator id: 136891
Operator id: 3342
Operator id: 3342
Operator id: 3342
Operator id: 3342
Operator id: 3342
Operator id: 3342
Potassium: 3.5
Potassium: 3.6
Potassium: 3.7
Potassium: 3.9
Potassium: 4.4
Potassium: 4.8
Potassium: 6 — ABNORMAL HIGH
Sodium: 132 — ABNORMAL LOW
Sodium: 137
Sodium: 137
Sodium: 140
Sodium: 141
Sodium: 141
Sodium: 142

## 2011-08-16 LAB — APTT
aPTT: 29
aPTT: 48 — ABNORMAL HIGH
aPTT: 64 — ABNORMAL HIGH

## 2011-08-16 LAB — CALCIUM: Calcium: 8.7

## 2011-08-16 LAB — POCT CARDIAC MARKERS
CKMB, poc: 3.1
Myoglobin, poc: 68.2
Operator id: 294511
Troponin i, poc: 0.05

## 2011-08-16 LAB — POCT I-STAT 3, VENOUS BLOOD GAS (G3P V)
Acid-base deficit: 3 — ABNORMAL HIGH
Bicarbonate: 25.1 — ABNORMAL HIGH
O2 Saturation: 91
Operator id: 3342
TCO2: 27
pCO2, Ven: 59.9 — ABNORMAL HIGH
pH, Ven: 7.231 — ABNORMAL LOW
pO2, Ven: 75 — ABNORMAL HIGH

## 2011-08-16 LAB — POCT I-STAT GLUCOSE
Glucose, Bld: 105 — ABNORMAL HIGH
Glucose, Bld: 108 — ABNORMAL HIGH
Operator id: 140821
Operator id: 3342

## 2011-08-16 LAB — LIPID PANEL
Cholesterol: 208 — ABNORMAL HIGH
Cholesterol: 140
Cholesterol: 149
HDL: 35 — ABNORMAL LOW
HDL: 32 — ABNORMAL LOW
HDL: 32 — ABNORMAL LOW
LDL Cholesterol: 143 — ABNORMAL HIGH
LDL Cholesterol: 89
LDL Cholesterol: 97
Total CHOL/HDL Ratio: 5.9
Total CHOL/HDL Ratio: 4.4
Total CHOL/HDL Ratio: 4.7
Triglycerides: 152 — ABNORMAL HIGH
Triglycerides: 101
Triglycerides: 93
VLDL: 30
VLDL: 19
VLDL: 20

## 2011-08-16 LAB — PREPARE PLATELET PHERESIS

## 2011-08-16 LAB — PROTIME-INR
INR: 1
INR: 1
INR: 1.8 — ABNORMAL HIGH
Prothrombin Time: 12.9
Prothrombin Time: 13.4
Prothrombin Time: 21.7 — ABNORMAL HIGH

## 2011-08-16 LAB — HEMOGLOBIN AND HEMATOCRIT, BLOOD
HCT: 23.5 — ABNORMAL LOW
Hemoglobin: 8.1 — ABNORMAL LOW

## 2011-08-16 LAB — HEPARIN LEVEL (UNFRACTIONATED)
Heparin Unfractionated: 0.45
Heparin Unfractionated: 0.47
Heparin Unfractionated: 0.66
Heparin Unfractionated: 0.29 — ABNORMAL LOW
Heparin Unfractionated: <0.1 — ABNORMAL LOW

## 2011-08-16 LAB — MAGNESIUM
Magnesium: 2
Magnesium: 2.3
Magnesium: 2.4
Magnesium: 3 — ABNORMAL HIGH

## 2011-08-16 LAB — CREATININE, SERUM
Creatinine, Ser: 0.89
Creatinine, Ser: 1.03
GFR calc Af Amer: >60
GFR calc Af Amer: >60
GFR calc non Af Amer: >60
GFR calc non Af Amer: >60

## 2011-08-16 LAB — ABO/RH: ABO/RH(D): B POS

## 2011-08-16 LAB — PLATELET COUNT
Platelets: 165
Platelets: 75 — ABNORMAL LOW
Platelets: 82 — ABNORMAL LOW

## 2011-08-16 LAB — TYPE AND SCREEN
ABO/RH(D): B POS
Antibody Screen: NEGATIVE

## 2011-08-16 LAB — TSH: TSH: 4.786

## 2011-08-16 LAB — HEMOGLOBIN A1C
Hgb A1c MFr Bld: 5.5
Hgb A1c MFr Bld: 5.9
Mean Plasma Glucose: 119
Mean Plasma Glucose: 133

## 2011-08-16 LAB — TROPONIN I: Troponin I: 0.93

## 2011-08-16 LAB — CK TOTAL AND CKMB (NOT AT ARMC)
CK, MB: 30.7 — ABNORMAL HIGH
Relative Index: 9.7 — ABNORMAL HIGH
Total CK: 316 — ABNORMAL HIGH

## 2011-08-16 LAB — PHOSPHORUS: Phosphorus: 1.8 — ABNORMAL LOW

## 2012-08-21 HISTORY — PX: NM MYOCAR PERF WALL MOTION: HXRAD629

## 2013-08-10 ENCOUNTER — Other Ambulatory Visit (HOSPITAL_COMMUNITY): Payer: Self-pay | Admitting: Cardiovascular Disease

## 2013-08-10 DIAGNOSIS — R0989 Other specified symptoms and signs involving the circulatory and respiratory systems: Secondary | ICD-10-CM

## 2013-08-10 DIAGNOSIS — I4510 Unspecified right bundle-branch block: Secondary | ICD-10-CM

## 2013-08-13 ENCOUNTER — Other Ambulatory Visit: Payer: Self-pay | Admitting: Cardiovascular Disease

## 2013-08-13 LAB — CBC WITH DIFFERENTIAL/PLATELET
Basophils Absolute: 0 10*3/uL (ref 0.0–0.1)
Basophils Relative: 0 % (ref 0–1)
Eosinophils Absolute: 0.2 10*3/uL (ref 0.0–0.7)
Eosinophils Relative: 4 % (ref 0–5)
HCT: 43.1 % (ref 39.0–52.0)
Hemoglobin: 15.1 g/dL (ref 13.0–17.0)
Lymphocytes Relative: 34 % (ref 12–46)
Lymphs Abs: 1.9 10*3/uL (ref 0.7–4.0)
MCH: 32.7 pg (ref 26.0–34.0)
MCHC: 35 g/dL (ref 30.0–36.0)
MCV: 93.3 fL (ref 78.0–100.0)
Monocytes Absolute: 0.7 10*3/uL (ref 0.1–1.0)
Monocytes Relative: 13 % — ABNORMAL HIGH (ref 3–12)
Neutro Abs: 2.8 10*3/uL (ref 1.7–7.7)
Neutrophils Relative %: 49 % (ref 43–77)
Platelets: 156 10*3/uL (ref 150–400)
RBC: 4.62 MIL/uL (ref 4.22–5.81)
RDW: 13.6 % (ref 11.5–15.5)
WBC: 5.6 10*3/uL (ref 4.0–10.5)

## 2013-08-13 LAB — COMPREHENSIVE METABOLIC PANEL
ALT: 23 U/L (ref 0–53)
AST: 25 U/L (ref 0–37)
Albumin: 4.1 g/dL (ref 3.5–5.2)
Alkaline Phosphatase: 51 U/L (ref 39–117)
BUN: 18 mg/dL (ref 6–23)
CO2: 27 mEq/L (ref 19–32)
Calcium: 9.1 mg/dL (ref 8.4–10.5)
Chloride: 109 mEq/L (ref 96–112)
Creat: 1.02 mg/dL (ref 0.50–1.35)
Glucose, Bld: 105 mg/dL — ABNORMAL HIGH (ref 70–99)
Potassium: 4.6 mEq/L (ref 3.5–5.3)
Sodium: 143 mEq/L (ref 135–145)
Total Bilirubin: 0.7 mg/dL (ref 0.3–1.2)
Total Protein: 6.8 g/dL (ref 6.0–8.3)

## 2013-08-13 LAB — LIPID PANEL
Cholesterol: 132 mg/dL (ref 0–200)
HDL: 44 mg/dL (ref >39)
LDL Cholesterol: 74 mg/dL (ref 0–99)
Total CHOL/HDL Ratio: 3 Ratio
Triglycerides: 72 mg/dL (ref <150)
VLDL: 14 mg/dL (ref 0–40)

## 2013-08-13 LAB — TSH: TSH: 2.531 u[IU]/mL (ref 0.350–4.500)

## 2013-08-14 LAB — PSA: PSA: 2.39 ng/mL (ref <=4.00)

## 2013-08-31 ENCOUNTER — Other Ambulatory Visit: Payer: Self-pay | Admitting: *Deleted

## 2013-08-31 MED ORDER — ROSUVASTATIN CALCIUM 40 MG PO TABS: 40.0000 mg | ORAL_TABLET | Freq: Every day | ORAL | Status: DC

## 2013-08-31 MED ORDER — NIACIN ER (ANTIHYPERLIPIDEMIC) 500 MG PO TBCR: 500.0000 mg | EXTENDED_RELEASE_TABLET | Freq: Every day | ORAL | Status: DC

## 2013-08-31 NOTE — Telephone Encounter (Signed)
Rx was sent to pharmacy electronically. 

## 2013-09-07 ENCOUNTER — Ambulatory Visit (HOSPITAL_COMMUNITY)
Admission: RE | Admit: 2013-09-07 | Discharge: 2013-09-07 | Disposition: A | Discharge status: Home / Self Care | Payer: BC Managed Care – HMO | Source: Ambulatory Visit | Attending: Cardiovascular Disease | Admitting: Cardiovascular Disease

## 2013-09-07 DIAGNOSIS — I4510 Unspecified right bundle-branch block: Secondary | ICD-10-CM

## 2013-09-07 DIAGNOSIS — I2510 Atherosclerotic heart disease of native coronary artery without angina pectoris: Secondary | ICD-10-CM

## 2013-09-07 NOTE — Progress Notes (Signed)
2D Echo Performed 09/07/2013    Earsie Humm, RCS  

## 2013-09-09 ENCOUNTER — Ambulatory Visit (HOSPITAL_COMMUNITY)
Admission: RE | Admit: 2013-09-09 | Discharge: 2013-09-09 | Disposition: A | Discharge status: Home / Self Care | Payer: BC Managed Care – HMO | Source: Ambulatory Visit | Attending: Cardiovascular Disease | Admitting: Cardiovascular Disease

## 2013-09-09 DIAGNOSIS — R0989 Other specified symptoms and signs involving the circulatory and respiratory systems: Secondary | ICD-10-CM

## 2013-09-09 NOTE — Progress Notes (Signed)
Carotid Duplex Completed. °Brianna L Mazza,RVT °

## 2014-02-08 ENCOUNTER — Encounter: Payer: Self-pay | Admitting: Cardiovascular Disease

## 2014-02-08 ENCOUNTER — Ambulatory Visit (INDEPENDENT_AMBULATORY_CARE_PROVIDER_SITE_OTHER): Payer: BC Managed Care – HMO | Admitting: Cardiovascular Disease

## 2014-02-08 VITALS — BP 126/80 | HR 53 | Ht 68.0 in | Wt 192.4 lb

## 2014-02-08 DIAGNOSIS — I2510 Atherosclerotic heart disease of native coronary artery without angina pectoris: Secondary | ICD-10-CM

## 2014-02-08 DIAGNOSIS — Z9089 Acquired absence of other organs: Secondary | ICD-10-CM

## 2014-02-08 DIAGNOSIS — E785 Hyperlipidemia, unspecified: Secondary | ICD-10-CM

## 2014-02-08 DIAGNOSIS — R6882 Decreased libido: Secondary | ICD-10-CM

## 2014-02-08 DIAGNOSIS — Z9889 Other specified postprocedural states: Secondary | ICD-10-CM

## 2014-02-08 NOTE — Patient Instructions (Addendum)
Your physician recommends that you schedule a follow-up appointment in: 1 year. No changes were made today in your therapy. 

## 2014-03-05 ENCOUNTER — Encounter: Payer: Self-pay | Admitting: Cardiovascular Disease

## 2014-03-05 DIAGNOSIS — I2510 Atherosclerotic heart disease of native coronary artery without angina pectoris: Secondary | ICD-10-CM | POA: Insufficient documentation

## 2014-03-05 DIAGNOSIS — E785 Hyperlipidemia, unspecified: Secondary | ICD-10-CM | POA: Insufficient documentation

## 2014-03-05 DIAGNOSIS — Z9089 Acquired absence of other organs: Secondary | ICD-10-CM | POA: Insufficient documentation

## 2014-03-05 DIAGNOSIS — R6882 Decreased libido: Secondary | ICD-10-CM | POA: Insufficient documentation

## 2014-03-05 NOTE — Progress Notes (Signed)
Patient ID: Carlos [name], male   DOB: [birth year], 73 y.o.   MRN: [id number]      PATIENT PROFILE: [name] is a 73 year old former patient of Dr. Rollene [name] who presents to the office to establish cardiology care with me today after Dr. Lowella [name] retirement from his recent solo practice   HPI: [name] is a very pleasant 73 year old gentleman was born in [place], [place], and works for [employer].  He has established coronary artery disease and in June 2009 underwent CABG surgery x5 by Dr. Arvid [name] after presenting with a non-ST segment elevation myocardial infarction.  L. placed to the LAD, vein graft to the second diagonal branch of the LAD, sequential vein graft to the first and second obtuse marginal branches of the circumflex coronary artery, and vein graft to the PDA branch of the [name] coronary artery with endoscopic vein harvesting from both thighs.  He is not undergone cardiac catheterizations since that time.  His last nuclear perfusion study was in October 2013, which did not show any ischemia.  He continues to exercise approximately 3 days per week.  He last saw Dr. Rollene [name], possibly 6 months ago.  In October 2014, he underwent carotid duplex imaging, which showed mild plaque without significant stenoses, bilaterally.  Laboratory in September 2014 showed a total close to 132, triglycerides 72, LDL 74, HDL 44 on his current dose of Crestor 40 mg in addition to Niaspan 500 mg.  There was no evidence for anemia.  Glucose was minimally increased at 153, TSH was normal as was his PSA.  An echo Doppler study in October 2014 showed an ejection fraction of 60-65% with normal wall motion.  He presents now to the office to establish cardiology care with me.  Past surgical history is notable for his CABG surgery, tonsillectomy, and tear duct surgery.   Current Outpatient Prescriptions  Medication Sig Dispense Refill  . aspirin 81 MG tablet Take 81 mg by mouth daily.      Marland Kitchen  BYSTOLIC 5 MG tablet Take 1 tablet by mouth daily.      . niacin (NIASPAN) 500 MG CR tablet Take 1 tablet (500 mg total) by mouth at bedtime.  90 tablet  3  . rosuvastatin (CRESTOR) 40 MG tablet Take 1 tablet (40 mg total) by mouth daily.  90 tablet  3   No current facility-administered medications for this visit.    Socially, he is married for 23 years.  He has 2 children.  He works for [employer] with [employer].  As a master's degree.  He did previously smoke cigarettes for 20 years, but quit in 1995.  He does drink wine.  He does exercise at minimum 3 days per week.  Family History  Problem Relation Age of Onset  . Heart attack Maternal Grandfather     ROS is negative for fever chills or night sweats.  He denies change in weight.  He denies change in vision or hearing.  He is unaware of lymphadenopathy.  He denies PND or orthopnea.  He is unaware of presyncope or syncope.  There are no palpitations.  He denies current chest pressure.  He denies nausea, vomiting, or diarrhea.  He is unaware of blood in stool or urine.  Has had some issues with decreased libido and hair loss.  He denies myalgias.  Denies claudication.  He denies tremors.  There is no diabetes.  He is unaware of thyroid issues.  He denies significant sleep disturbance.  Other  comprehensive 14 point system review is negative.  PE BP 126/80  Pulse 53  Ht $R'5\' 8"'VZ$  (1.727 m)  Wt 192 lb 6.4 oz (87.272 kg)  BMI 29.26 kg/m2 General: Alert, oriented, no distress.  Skin: normal turgor, no rashes; warm and dry HEENT: Normocephalic, atraumatic. Pupils round and reactive; sclera anicteric; extraocular muscles intact; Fundi ** Nose without nasal septal hypertrophy Mouth/Parynx benign; Mallinpatti scale 2 Neck: No JVD, no carotid bruits; normal carotid upstroke Lungs: clear to ausculatation and percussion; no wheezing or rales Chest wall: without tenderness to palpitation Heart: RRR, s1 s2 normal , 5-3/9 systolic murmur heard at the mid  and [name] and left sternal border Abdomen: soft, nontender; no hepatosplenomehaly, BS+; abdominal aorta nontender and not dilated by palpation. Back: no CVA tenderness Pulses 2+ Extremities: no clubbinbg cyanosis or edema, Homan's sign negative  Neurologic: grossly nonfocal; Cranial nerves grossly wnl Psychologic: Normal mood and affect    ECG (independently read by me): Sinus bradycardia 53 beats per minute.  No ectopy.  No significant change from his prior ECG from September 2014  LABS:  BMET    Component Value Date/Time   NA 143 08/13/2013 0829   K 4.6 08/13/2013 0829   CL 109 08/13/2013 0829   CO2 27 08/13/2013 0829   GLUCOSE 105* 08/13/2013 0829   BUN 18 08/13/2013 0829   CREATININE 1.02 08/13/2013 0829   CREATININE 0.95 05/08/2008 0325   CALCIUM 9.1 08/13/2013 0829   GFRNONAA >60 05/08/2008 0325   GFRAA  Value: >60        The eGFR has been calculated using the MDRD equation. This calculation has not been validated in all clinical 05/08/2008 0325     Hepatic Function Panel     Component Value Date/Time   PROT 6.8 08/13/2013 0829   ALBUMIN 4.1 08/13/2013 0829   AST 25 08/13/2013 0829   ALT 23 08/13/2013 0829   ALKPHOS 51 08/13/2013 0829   BILITOT 0.7 08/13/2013 0829     CBC    Component Value Date/Time   WBC 5.6 08/13/2013 0829   RBC 4.62 08/13/2013 0829   HGB 15.1 08/13/2013 0829   HCT 43.1 08/13/2013 0829   PLT 156 08/13/2013 0829   MCV 93.3 08/13/2013 0829   MCH 32.7 08/13/2013 0829   MCHC 35.0 08/13/2013 0829   RDW 13.6 08/13/2013 0829   LYMPHSABS 1.9 08/13/2013 0829   MONOABS 0.7 08/13/2013 0829   EOSABS 0.2 08/13/2013 0829   BASOSABS 0.0 08/13/2013 0829     BNP    Component Value Date/Time   PROBNP <30.0 05/01/2008 0420    Lipid Panel     Component Value Date/Time   CHOL 132 08/13/2013 0829   TRIG 72 08/13/2013 0829   HDL 44 08/13/2013 0829   CHOLHDL 3.0 08/13/2013 0829   VLDL 14 08/13/2013 0829   LDLCALC 74 08/13/2013 0829     RADIOLOGY: No results  found.   ASSESSMENT AND PLAN: [name] is a 73 year old gentleman, who suffered a non-ST segment elevation myocardial infarction in June 2009 and underwent CABG surgery x5 by Dr. Cyndia Bent.  His last nuclear perfusion study was in October 2013, which continued to show normal perfusion without scar or ischemia.  This continues to be stable on his current medical regimen.  I did review with him the recent echo Doppler study that he had done after Dr. Lowella [name] retirement.  This showed an ejection fraction of 60, 65% without wall motion abnormalities.  I did review recent laboratory.  He is tolerating Crestor for his hyperlipidemia with LDL at 74.  He's not having any anginal symptoms.  He continues to exercise regularly on his current medical regimen.  There no signs of anemia.  He will continue current therapy as prescribed.  I will see him in one year for followup cardiology evaluation.   Troy Sine, MD, [facility] 03/05/2014 4:58 PM

## 2014-09-01 ENCOUNTER — Telehealth: Payer: Self-pay | Admitting: Cardiovascular Disease

## 2014-09-01 MED ORDER — NIACIN ER (ANTIHYPERLIPIDEMIC) 500 MG PO TBCR: 500.0000 mg | EXTENDED_RELEASE_TABLET | Freq: Every day | ORAL | Status: DC

## 2014-09-01 MED ORDER — NEBIVOLOL HCL 5 MG PO TABS: 5.0000 mg | ORAL_TABLET | Freq: Every day | ORAL | Status: DC

## 2014-09-01 MED ORDER — ROSUVASTATIN CALCIUM 40 MG PO TABS: 40.0000 mg | ORAL_TABLET | Freq: Every day | ORAL | Status: DC

## 2014-09-01 NOTE — Telephone Encounter (Signed)
Rx was sent to pharmacy electronically. Patient notified.  

## 2014-09-01 NOTE — Telephone Encounter (Signed)
Pt need his prescriptions for 6 months for Bystolic,Crestor and Niaspan(he thinks),Pt did not have his medicine with him. Please call to [facility] on [address].He did not know their phone number.

## 2014-09-09 ENCOUNTER — Other Ambulatory Visit: Payer: Self-pay | Admitting: Cardiovascular Disease

## 2014-09-09 NOTE — Telephone Encounter (Signed)
Niacin & Crestor refilled #[id number] with 1 refilled on 09/01/14

## 2015-02-16 ENCOUNTER — Telehealth: Payer: Self-pay | Admitting: Cardiovascular Disease

## 2015-02-16 NOTE — Telephone Encounter (Signed)
LM on name-verified VM that patient should take medications as directed before appointment with Dr. Claiborne Billings

## 2015-02-16 NOTE — Telephone Encounter (Signed)
Pt says that he to coming to see Dr. Claiborne Billings on 4/1 and he would like to know if there are any medication that he [place] take before coming in for the appt. Please call back   Thanks

## 2015-02-18 ENCOUNTER — Ambulatory Visit (INDEPENDENT_AMBULATORY_CARE_PROVIDER_SITE_OTHER): Payer: BLUE CROSS/BLUE SHIELD | Admitting: Cardiovascular Disease

## 2015-02-18 VITALS — BP 142/70 | HR 49 | Ht 68.0 in | Wt 193.3 lb

## 2015-02-18 DIAGNOSIS — I2510 Atherosclerotic heart disease of native coronary artery without angina pectoris: Secondary | ICD-10-CM

## 2015-02-18 DIAGNOSIS — R6882 Decreased libido: Secondary | ICD-10-CM

## 2015-02-18 DIAGNOSIS — Z79899 Other long term (current) drug therapy: Secondary | ICD-10-CM | POA: Diagnosis not present

## 2015-02-18 DIAGNOSIS — E785 Hyperlipidemia, unspecified: Secondary | ICD-10-CM

## 2015-02-18 DIAGNOSIS — R001 Bradycardia, unspecified: Secondary | ICD-10-CM

## 2015-02-18 NOTE — Patient Instructions (Addendum)
Your physician wants you to follow-up in: 1 Year You will receive a reminder letter in the mail two months in advance. If you don't receive a letter, please call our office to schedule the follow-up appointment.  Your physician has requested that you have en exercise stress myoview. For further information please visit [email or web address]. Please follow instruction sheet, as given. 1 Year  Your physician recommends that you return for lab work

## 2015-02-19 ENCOUNTER — Encounter: Payer: Self-pay | Admitting: Cardiovascular Disease

## 2015-02-19 DIAGNOSIS — R001 Bradycardia, unspecified: Secondary | ICD-10-CM | POA: Insufficient documentation

## 2015-02-19 NOTE — Progress Notes (Signed)
Patient ID: Carlos [name], male   DOB: 05/29/1941, 74 y.o.   MRN: [id number]       HPI: [name] is a 74 year old gentleman was born in [place], [place], and works for [employer].  He is a former patient of Dr. Rollene Fare, and established cardiology care with me one year ago.  He presents for one-year follow-up evaluation.   History Onofrio has has established coronary artery disease and in June 2009 underwent CABG surgery x5 by Dr. Gilford Raid after presenting with a non-ST segment elevation myocardial infarction. A LIMA was placed to the LAD, a  SVG to the second diagonal branch of the LAD, sequential vein graft to the first and second obtuse marginal branches of the circumflex coronary artery, and vein graft to the PDA branch of the RCA with endoscopic vein harvesting from both thighs.  He is not undergone cardiac catheterizations since that time.  His last nuclear perfusion study in October 2013 did not show any ischemia. In October 2014, he underwent carotid duplex imaging, which showed mild plaque without significant stenoses, bilaterally.  Laboratory in September 2014 showed a TC132, triglycerides 72, LDL 74, HDL 44 on  Crestor 40 mg in addition to Niaspan 500 mg.  There was no evidence for anemia.  Glucose was minimally increased at 153, TSH was normal as was his PSA.  An echo Doppler study in October 2014 showed an ejection fraction of 60-65% with normal wall motion.  Since I saw him one year ago, he continues to be active.  He works in Arts administrator for [employer].  He denies any episodes of chest pressure.  He is unaware of tachycardia.  He denies shortness of breath.  He specifically denies any presyncope or syncope and is unaware of palpitations.  Past surgical history is notable for his CABG surgery, tonsillectomy, and tear duct surgery.   Current Outpatient Prescriptions  Medication Sig Dispense Refill  . aspirin 81 MG tablet Take 81 mg by mouth daily.    . nebivolol (BYSTOLIC) 5 MG  tablet Take 1 tablet (5 mg total) by mouth daily. 90 tablet 1  . niacin (NIASPAN) 500 MG CR tablet Take 1 tablet (500 mg total) by mouth at bedtime. 90 tablet 1  . rosuvastatin (CRESTOR) 40 MG tablet Take 1 tablet (40 mg total) by mouth daily. 90 tablet 1   No current facility-administered medications for this visit.    Socially, he is married for 23 years.  He has 2 children.  He works for Stage [employer] with [employer].  As a master's degree.  He did previously smoke cigarettes for 20 years, but quit in 1995.  He does drink wine.  He does exercise at minimum 3 days per week.  Family History  Problem Relation Age of Onset  . Heart attack Maternal Grandfather    ROS General: Negative; No fevers, chills, or night sweats;  HEENT: Negative; No changes in vision or hearing, sinus congestion, difficulty swallowing Pulmonary: Negative; No cough, wheezing, shortness of breath, hemoptysis Cardiovascular: See history of present illness GI: Negative; No nausea, vomiting, diarrhea, or abdominal pain GU: He has had some issues with decreased libido. Musculoskeletal: Negative; no myalgias, joint pain, or weakness Hematologic/Oncology: Negative; no easy bruising, bleeding Endocrine: Negative; no heat/cold intolerance; no diabetes Neuro: Negative; no changes in balance, headaches Skin: Negative; No rashes or skin lesions Psychiatric: Negative; No behavioral problems, depression Sleep: Negative; No snoring, daytime sleepiness, hypersomnolence, bruxism, restless legs, hypnogognic hallucinations, no cataplexy Other comprehensive 14  point system review is negative.  PE BP 142/70 mmHg  Pulse 49  Ht $R'5\' 8"'hC$  (1.727 m)  Wt 193 lb 4.8 oz (87.68 kg)  BMI 29.40 kg/m2 General: Alert, oriented, no distress.  Skin: normal turgor, no rashes; warm and dry HEENT: Normocephalic, atraumatic. Pupils round and reactive; sclera anicteric; extraocular muscles intact; Fundi within normal limits Nose without nasal septal  hypertrophy Mouth/Parynx benign; Mallinpatti scale 2 Neck: No JVD, no carotid bruits; normal carotid upstroke Lungs: clear to ausculatation and percussion; no wheezing or rales Chest wall: without tenderness to palpitation Heart: RRR, s1 s2 normal , 5-1/7 systolic murmur heard at the mid and right and left sternal border, unchanged; no S3 or S4 gallop.  No rubs thrills or heaves Abdomen: soft, nontender; no hepatosplenomehaly, BS+; abdominal aorta nontender and not dilated by palpation. Back: no CVA tenderness Pulses 2+ Extremities: no clubbinbg cyanosis or edema, Homan's sign negative  Neurologic: grossly nonfocal; Cranial nerves grossly wnl Psychologic: Normal mood and affect  ECG (independently read by me): Sinus bradycardia at 49 bpm with first-degree AV block.  PR interval 212 ms.  QTc interval 433 ms.  Incomplete right bundle branch block.  March 2015 ECG (independently read by me): Sinus bradycardia 53 beats per minute.  No ectopy.  No significant change from his prior ECG from September 2014  LABS:  BMET  BMP Latest Ref Rng 08/13/2013 05/08/2008 05/07/2008  Glucose 70 - 99 mg/dL 105(H) 101(H) 121(H)  BUN 6 - 23 mg/dL 18 21 24(H)  Creatinine 0.50 - 1.35 mg/dL 1.02 0.95 1.03  Sodium 135 - 145 mEq/L 143 136 137  Potassium 3.5 - 5.3 mEq/L 4.6 3.7 3.9  Chloride 96 - 112 mEq/L 109 104 105  CO2 19 - 32 mEq/L $Remove'27 25 26  'QVmFxeT$ Calcium 8.4 - 10.5 mg/dL 9.1 7.6(L) 7.7(L)     Hepatic Function Panel   Hepatic Function Latest Ref Rng 08/13/2013 05/05/2008  Total Protein 6.0 - 8.3 g/dL 6.8 6.5  Albumin 3.5 - 5.2 g/dL 4.1 3.3(L)  AST 0 - 37 U/L 25 31  ALT 0 - 53 U/L 23 32  Alk Phosphatase 39 - 117 U/L 51 43  Total Bilirubin 0.3 - 1.2 mg/dL 0.7 0.9     CBC   CBC Latest Ref Rng 08/13/2013 05/08/2008 05/07/2008  WBC 4.0 - 10.5 K/uL 5.6 7.9 7.6  Hemoglobin 13.0 - 17.0 g/dL 15.1 8.0(L) 8.4(L)  Hematocrit 39.0 - 52.0 % 43.1 23.3(L) 23.5(L)  Platelets 150 - 400 K/uL 156 110(L) 69 DELTA CHECK  NOTED(L)    BNP    Component Value Date/Time   PROBNP <30.0 05/01/2008 0420     Lipid Panel     Component Value Date/Time   CHOL 132 08/13/2013 0829   TRIG 72 08/13/2013 0829   HDL 44 08/13/2013 0829   CHOLHDL 3.0 08/13/2013 0829   VLDL 14 08/13/2013 0829   LDLCALC 74 08/13/2013 0829     RADIOLOGY: No results found.   ASSESSMENT AND PLAN: Mr. Emmerich is a 74 year old gentleman who suffered a NSTEMI  in June 2009 and underwent CABG surgery x5 by Dr. Cyndia Bent.  His last nuclear perfusion study was in October 2013 and continued to show normal perfusion without scar or ischemia.  He continues to be clinically stable on his current medical regimen.  He has not had recent laboratory and this will be recommended. His ECG shows sinus bradycardia today.  He states his blood pressure typically at home is normal and  when he gets  onto a treadmill before exercising his puldi is typically between 60 and 65.  He denies any presyncope or episodes of increasing fatigability.  His blood pressure today is stable.  Target LDL is less than 70 and he seems to be tolerating combination therapy with low-dose niacin and Crestor.  If follow-up laboratory is similar he may be able to be taken off niacin.  In 1 year, I am recommending a follow-up exercise Myoview study since it will be 8 years following his CABG surgery at that time.  I will see him back in the office in follow-up and further recommendations will be made at that time.   Troy Sine, MD, [facility] 02/19/2015 3:01 PM

## 2015-02-28 ENCOUNTER — Encounter: Payer: Self-pay | Admitting: *Deleted

## 2015-03-05 ENCOUNTER — Other Ambulatory Visit: Payer: Self-pay | Admitting: Cardiovascular Disease

## 2015-03-07 NOTE — Telephone Encounter (Signed)
Rx(s) sent to pharmacy electronically.  

## 2015-09-23 ENCOUNTER — Other Ambulatory Visit: Payer: Self-pay | Admitting: Gastroenterology

## 2015-09-27 ENCOUNTER — Encounter (HOSPITAL_COMMUNITY): Payer: Self-pay | Admitting: *Deleted

## 2015-09-28 ENCOUNTER — Other Ambulatory Visit: Payer: Self-pay | Admitting: Gastroenterology

## 2015-10-03 ENCOUNTER — Encounter (HOSPITAL_COMMUNITY): Payer: Self-pay

## 2015-10-03 ENCOUNTER — Ambulatory Visit (HOSPITAL_COMMUNITY)
Admission: RE | Admit: 2015-10-03 | Discharge: 2015-10-03 | Disposition: A | Discharge status: Home / Self Care | Payer: BLUE CROSS/BLUE SHIELD | Source: Ambulatory Visit | Attending: Gastroenterology | Admitting: Gastroenterology

## 2015-10-03 ENCOUNTER — Encounter (HOSPITAL_COMMUNITY)
Admission: RE | Disposition: A | Discharge status: Home / Self Care | Payer: Self-pay | Source: Ambulatory Visit | Attending: Gastroenterology

## 2015-10-03 ENCOUNTER — Ambulatory Visit (HOSPITAL_COMMUNITY): Payer: BLUE CROSS/BLUE SHIELD | Admitting: Certified Registered Nurse Anesthetist

## 2015-10-03 DIAGNOSIS — Z951 Presence of aortocoronary bypass graft: Secondary | ICD-10-CM | POA: Diagnosis not present

## 2015-10-03 DIAGNOSIS — Z1211 Encounter for screening for malignant neoplasm of colon: Secondary | ICD-10-CM | POA: Diagnosis present

## 2015-10-03 DIAGNOSIS — I10 Essential (primary) hypertension: Secondary | ICD-10-CM | POA: Diagnosis not present

## 2015-10-03 DIAGNOSIS — E7800 Pure hypercholesterolemia, unspecified: Secondary | ICD-10-CM | POA: Diagnosis not present

## 2015-10-03 DIAGNOSIS — K5730 Diverticulosis of large intestine without perforation or abscess without bleeding: Secondary | ICD-10-CM | POA: Insufficient documentation

## 2015-10-03 DIAGNOSIS — Z86010 Personal history of colonic polyps: Secondary | ICD-10-CM | POA: Diagnosis not present

## 2015-10-03 HISTORY — DX: Atherosclerotic heart disease of native coronary artery without angina pectoris: I25.10

## 2015-10-03 HISTORY — PX: COLONOSCOPY WITH PROPOFOL: SHX5780

## 2015-10-03 SURGERY — COLONOSCOPY WITH PROPOFOL
Anesthesia: Monitor Anesthesia Care

## 2015-10-03 MED ORDER — LIDOCAINE HCL (CARDIAC) 20 MG/ML IV SOLN
INTRAVENOUS | Status: AC
  Filled 2015-10-03: qty 5

## 2015-10-03 MED ORDER — PROPOFOL 10 MG/ML IV BOLUS
INTRAVENOUS | Status: DC | PRN
  Administered 2015-10-03 (×3): 30 mg via INTRAVENOUS
  Administered 2015-10-03 (×2): 10 mg via INTRAVENOUS
  Administered 2015-10-03: 50 mg via INTRAVENOUS

## 2015-10-03 MED ORDER — SODIUM CHLORIDE 0.9 % IV SOLN: INTRAVENOUS | Status: DC

## 2015-10-03 MED ORDER — PROPOFOL 10 MG/ML IV BOLUS
INTRAVENOUS | Status: AC
  Filled 2015-10-03: qty 20

## 2015-10-03 MED ORDER — LACTATED RINGERS IV SOLN
INTRAVENOUS | Status: DC | PRN
  Administered 2015-10-03: 12:00:00 via INTRAVENOUS

## 2015-10-03 MED ORDER — LIDOCAINE HCL (CARDIAC) 20 MG/ML IV SOLN
INTRAVENOUS | Status: DC | PRN
  Administered 2015-10-03: 50 mg via INTRAVENOUS
  Administered 2015-10-03: 30 mg via INTRAVENOUS

## 2015-10-03 SURGICAL SUPPLY — 22 items

## 2015-10-03 NOTE — Op Note (Signed)
Procedure: Surveillance colonoscopy. 08/09/2008 colonoscopy performed with removal of a 3 mm ascending colon tubular adenomatous polyp  Endoscopist: Earle Gell  Premedication: Propofol administered by anesthesia  Procedure: The patient was placed in the left lateral decubitus position. Anal inspection and digital rectal exam were normal. The Pentax pediatric colonoscope was introduced into the rectum and advanced to the cecum. A normal-appearing appendiceal orifice and ileocecal valve were identified. Colonic preparation for the exam today was good. Withdrawal time was 11 minutes  Rectum. Normal. Retroflexed view of the distal rectum was normal  Sigmoid colon and descending colon. Colonic diverticulosis  Splenic flexure. Normal  Transverse colon. Normal  Hepatic flexure. Normal  Ascending colon. Normal  Cecum and ileocecal valve. Normal  Assessment: Normal surveillance colonoscopy

## 2015-10-03 NOTE — H&P (Signed)
  Procedure: Surveillance colonoscopy. 08/09/2008 colonoscopy performed with removal of a 3 mm ascending colon tubular adenomatous polyp  History: The patient is a 74 year old male born Oct 28, 1941. He is scheduled to undergo a surveillance colonoscopy today.  Past medical history: Hypertension. Hypercholesterolemia. Coronary artery bypass grafting. Tonsillectomy.  Exam: The patient is alert and lying comfortably on the endoscopy stretcher. Abdomen is soft and nontender to palpation. Lungs are clear to auscultation. Cardiac exam reveals a regular rhythm.  Plan: Proceed with surveillance colonoscopy

## 2015-10-03 NOTE — Transfer of Care (Signed)
Immediate Anesthesia Transfer of Care Note  Patient: Carlos [name]  Procedure(s) Performed: Procedure(s): COLONOSCOPY WITH PROPOFOL (N/A)  Patient Location: PACU, Endo Recovery  Anesthesia Type:MAC  Level of Consciousness: Patient easily awoken, sedated, comfortable, cooperative, following commands, responds to stimulation.   Airway & Oxygen Therapy: Patient spontaneously breathing, ventilating well, oxygen via simple oxygen mask.  Post-op Assessment: Report given to PACU RN, vital signs reviewed and stable, moving all extremities.   Post vital signs: Reviewed and stable.  Complications: No apparent anesthesia complications

## 2015-10-03 NOTE — Discharge Instructions (Signed)
Colonoscopy, Care After °Refer to this sheet in the next few weeks. These instructions provide you with information on caring for yourself after your procedure. Your health care provider may also give you more specific instructions. Your treatment has been planned according to current medical practices, but problems sometimes occur. Call your health care provider if you have any problems or questions after your procedure. °WHAT TO EXPECT AFTER THE PROCEDURE  °After your procedure, it is typical to have the following: °· A small amount of blood in your stool. °· Moderate amounts of gas and mild abdominal cramping or bloating. °HOME CARE INSTRUCTIONS °· Do not drive, operate machinery, or sign important documents for 24 hours. °· You may shower and resume your regular physical activities, but move at a slower pace for the first 24 hours. °· Take frequent rest periods for the first 24 hours. °· Walk around or put a warm pack on your abdomen to help reduce abdominal cramping and bloating. °· Drink enough fluids to keep your urine clear or pale yellow. °· You may resume your normal diet as instructed by your health care provider. Avoid heavy or fried foods that are hard to digest. °· Avoid drinking alcohol for 24 hours or as instructed by your health care provider. °· Only take over-the-counter or prescription medicines as directed by your health care provider. °· If a tissue sample (biopsy) was taken during your procedure: °¨ Do not take aspirin or blood thinners for 7 days, or as instructed by your health care provider. °¨ Do not drink alcohol for 7 days, or as instructed by your health care provider. °¨ Eat soft foods for the first 24 hours. °SEEK MEDICAL CARE IF: °You have persistent spotting of blood in your stool 2-3 days after the procedure. °SEEK IMMEDIATE MEDICAL CARE IF: °· You have more than a small spotting of blood in your stool. °· You pass large blood clots in your stool. °· Your abdomen is swollen  (distended). °· You have nausea or vomiting. °· You have a fever. °· You have increasing abdominal pain that is not relieved with medicine. °  °This information is not intended to replace advice given to you by your health care provider. Make sure you discuss any questions you have with your health care provider. °  °Document Released: 06/19/2004 Document Revised: 08/26/2013 Document Reviewed: 07/13/2013 °Elsevier Interactive Patient Education ©2016 [employer] ° °

## 2015-10-03 NOTE — Anesthesia Preprocedure Evaluation (Signed)
Anesthesia Evaluation  Patient identified by MRN, date of birth, ID band Patient awake    Reviewed: Allergy & Precautions, NPO status , Patient's Chart, lab work & pertinent test results  Airway Mallampati: II   Neck ROM: full    Dental   Pulmonary former smoker,    breath sounds clear to auscultation       Cardiovascular + CAD and + CABG   Rhythm:regular Rate:Normal     Neuro/Psych    GI/Hepatic   Endo/Other    Renal/GU      Musculoskeletal   Abdominal   Peds  Hematology   Anesthesia Other Findings   Reproductive/Obstetrics                             Anesthesia Physical Anesthesia Plan  ASA: III  Anesthesia Plan: MAC   Post-op Pain Management:    Induction: Intravenous  Airway Management Planned: Simple Face Mask  Additional Equipment:   Intra-op Plan:   Post-operative Plan:   Informed Consent: I have reviewed the patients History and Physical, chart, labs and discussed the procedure including the risks, benefits and alternatives for the proposed anesthesia with the patient or authorized representative who has indicated his/her understanding and acceptance.     Plan Discussed with: CRNA, Anesthesiologist and Surgeon  Anesthesia Plan Comments:         Anesthesia Quick Evaluation

## 2015-10-04 ENCOUNTER — Encounter (HOSPITAL_COMMUNITY): Payer: Self-pay | Admitting: Gastroenterology

## 2015-10-04 NOTE — Anesthesia Postprocedure Evaluation (Signed)
Anesthesia Post Note  Patient: Carlos [name]  Procedure(s) Performed: Procedure(s) (LRB): COLONOSCOPY WITH PROPOFOL (N/A)  Anesthesia type: MAC  Patient location: PACU  Post pain: Pain level controlled and Adequate analgesia  Post assessment: Post-op Vital signs reviewed, Patient's Cardiovascular Status Stable and Respiratory Function Stable  Last Vitals:  Filed Vitals:   10/03/15 1340  BP: 130/65  Pulse: 45  Temp:   Resp: 15    Post vital signs: Reviewed and stable  Level of consciousness: awake, alert  and oriented  Complications: No apparent anesthesia complications

## 2015-12-12 ENCOUNTER — Telehealth: Payer: Self-pay | Admitting: Cardiovascular Disease

## 2015-12-12 NOTE — Telephone Encounter (Signed)
No note needed 

## 2015-12-12 NOTE — Telephone Encounter (Signed)
Closed encounter °

## 2016-01-31 ENCOUNTER — Telehealth: Payer: Self-pay | Admitting: Cardiovascular Disease

## 2016-01-31 NOTE — Telephone Encounter (Signed)
NEW MESSAGE   Pt calling for rn  He wants to know if you have received his lab results

## 2016-01-31 NOTE — Telephone Encounter (Signed)
Left message that labs received from [place] were drawn in November. If he has had any labs since then we have not received them.

## 2016-01-31 NOTE — Telephone Encounter (Signed)
Tried to return a call to patient. Number provided states it has been disconnected. Will try numbers on file.

## 2016-02-14 ENCOUNTER — Telehealth (HOSPITAL_COMMUNITY): Payer: Self-pay | Admitting: *Deleted

## 2016-02-14 NOTE — Telephone Encounter (Signed)
Patient given detailed instructions per Myocardial Perfusion Study Information Sheet for the test on 02/16/16 at 1000. Patient notified to arrive 15 minutes early and that it is imperative to arrive on time for appointment to keep from having the test rescheduled.  If you need to cancel or reschedule your appointment, please call the office within 24 hours of your appointment. Failure to do so may result in a cancellation of your appointment, and a $50 no show fee. Patient verbalized understanding.Nastasia Kage, Ranae Palms

## 2016-02-16 ENCOUNTER — Ambulatory Visit (HOSPITAL_COMMUNITY): Payer: Medicare Other | Attending: Cardiovascular Disease

## 2016-02-16 DIAGNOSIS — I2510 Atherosclerotic heart disease of native coronary artery without angina pectoris: Secondary | ICD-10-CM

## 2016-02-16 DIAGNOSIS — I10 Essential (primary) hypertension: Secondary | ICD-10-CM | POA: Diagnosis not present

## 2016-02-16 DIAGNOSIS — E785 Hyperlipidemia, unspecified: Secondary | ICD-10-CM

## 2016-02-16 DIAGNOSIS — R9439 Abnormal result of other cardiovascular function study: Secondary | ICD-10-CM | POA: Insufficient documentation

## 2016-02-16 DIAGNOSIS — I779 Disorder of arteries and arterioles, unspecified: Secondary | ICD-10-CM | POA: Insufficient documentation

## 2016-02-16 LAB — MYOCARDIAL PERFUSION IMAGING
LV dias vol: 121 mL (ref 62–150)
LV sys vol: 55 mL
Peak HR: 106 {beats}/min
RATE: 0.3
Rest HR: 48 {beats}/min
SDS: 0
SRS: 3
SSS: 3
TID: 1.1

## 2016-02-16 MED ORDER — TECHNETIUM TC 99M SESTAMIBI GENERIC - CARDIOLITE
32.2000 | Freq: Once | INTRAVENOUS | Status: AC | PRN
  Administered 2016-02-16: 32.2 via INTRAVENOUS

## 2016-02-16 MED ORDER — REGADENOSON 0.4 MG/5ML IV SOLN
0.4000 mg | Freq: Once | INTRAVENOUS | Status: AC
  Administered 2016-02-16: 0.4 mg via INTRAVENOUS

## 2016-02-16 MED ORDER — TECHNETIUM TC 99M SESTAMIBI GENERIC - CARDIOLITE
10.8000 | Freq: Once | INTRAVENOUS | Status: AC | PRN
  Administered 2016-02-16: 11 via INTRAVENOUS

## 2016-03-13 ENCOUNTER — Telehealth: Payer: Self-pay | Admitting: Cardiovascular Disease

## 2016-03-13 NOTE — Telephone Encounter (Signed)
Patient wanted to know if he should hold any medications prior to his visit or drink coffee. Advised patient that if MD orders labs, they will be fasting labs and he would likely not want to wait that long to eat for the day - he agreed. Advised to take meds as directed. He voiced understanding.

## 2016-03-13 NOTE — Telephone Encounter (Signed)
Follow Up   Pt request a call back to discuss if the labs are needed for 03/14/2016 appt

## 2016-03-14 ENCOUNTER — Encounter: Payer: Self-pay | Admitting: Cardiovascular Disease

## 2016-03-14 ENCOUNTER — Ambulatory Visit (INDEPENDENT_AMBULATORY_CARE_PROVIDER_SITE_OTHER): Payer: Medicare Other | Admitting: Cardiovascular Disease

## 2016-03-14 VITALS — BP 131/74 | HR 48 | Ht 68.0 in | Wt 193.4 lb

## 2016-03-14 DIAGNOSIS — E785 Hyperlipidemia, unspecified: Secondary | ICD-10-CM | POA: Diagnosis not present

## 2016-03-14 DIAGNOSIS — I2510 Atherosclerotic heart disease of native coronary artery without angina pectoris: Secondary | ICD-10-CM | POA: Diagnosis not present

## 2016-03-14 DIAGNOSIS — R001 Bradycardia, unspecified: Secondary | ICD-10-CM

## 2016-03-14 MED ORDER — NEBIVOLOL HCL 5 MG PO TABS: 5.0000 mg | ORAL_TABLET | Freq: Every day | ORAL | Status: DC

## 2016-03-14 NOTE — Patient Instructions (Signed)
Your physician wants you to follow-up in: 1 year or sooner if needed. You will receive a reminder letter in the mail two months in advance. If you don't receive a letter, please call our office to schedule the follow-up appointment.   If you need a refill on your cardiac medications before your next appointment, please call your pharmacy.   

## 2016-03-15 ENCOUNTER — Telehealth: Payer: Self-pay | Admitting: Cardiovascular Disease

## 2016-03-15 ENCOUNTER — Encounter: Payer: Self-pay | Admitting: Cardiovascular Disease

## 2016-03-15 MED ORDER — NEBIVOLOL HCL 2.5 MG PO TABS: 2.5000 mg | ORAL_TABLET | Freq: Every day | ORAL | Status: DC

## 2016-03-15 NOTE — Telephone Encounter (Signed)
Pt and Dr. Claiborne Billings discussed cutting dose to 2.5mg  daily. Pt was going to cut his 5mg  pills in half but states "trying this, they have a tendency to shatter". Advised pt to start taking the 2.5mg  tablets - prescription sent to pharmacy for pt's requested dispense.

## 2016-03-15 NOTE — Progress Notes (Signed)
Patient ID: RAJAT STAVER, male   DOB: 08/14/1941, 75 y.o.   MRN: [id number]      Primary M.D.: Dr. Loleta Books  HPI: Mr. Bailen is a 75 year old gentleman was born in [place], [place], and works for [employer].  He is a former patient of Dr. Rollene Fare, and established cardiology care with me one year ago.  He presents for one-year follow-up evaluation.   Mr. Mikiah has has CAD and in June 2009 underwent CABG surgery x5 by Dr. Gilford Raid after presenting with a non-ST segment elevation myocardial infarction. A LIMA was placed to the LAD, a  SVG to the second diagonal branch of the LAD, sequential vein graft to the first and second obtuse marginal branches of the circumflex coronary artery, and vein graft to the PDA branch of the RCA with endoscopic vein harvesting from both thighs.  He is not undergone cardiac catheterizations since that time.  His last nuclear perfusion study in October 2013 did not show any ischemia. In October 2014, he underwent carotid duplex imaging, which showed mild plaque without significant stenoses, bilaterally.  Laboratory in September 2014 showed a TC 132, triglycerides 72, LDL 74, HDL 44 on  Crestor 40 mg in addition to Niaspan 500 mg.  There was no evidence for anemia.  Glucose was minimally increased at 153, TSH was normal as was his PSA.  An echo Doppler study in October 2014 showed an ejection fraction of 60-65% with normal wall motion.  Since I saw him one year ago, he has retired from [employer] in November 2016 and continues to be active.  He underwent a nuclear stress test in 02/22/2016 which was low risk and had mild attenuation artifact in the apical lateral segment.  There was no ischemia.  Ejection fraction was 55% with normal wall motion.  He denies any episodes of chest pressure.  He is unaware of tachycardia.  He denies shortness of breath.  He specifically denies any presyncope or syncope and is unaware of palpitations.  He presents for evaluation.  Past  surgical history is notable for his CABG surgery, tonsillectomy, and tear duct surgery.   Current Outpatient Prescriptions  Medication Sig Dispense Refill  . aspirin 81 MG tablet Take 81 mg by mouth daily.    . niacin (NIASPAN) 500 MG CR tablet Take 1 tablet (500 mg total) by mouth at bedtime. (Patient taking differently: Take 500 mg by mouth daily. ) 90 tablet 3  . rosuvastatin (CRESTOR) 40 MG tablet Take 1 tablet (40 mg total) by mouth daily. 90 tablet 3  . nebivolol (BYSTOLIC) 2.5 MG tablet Take 1 tablet (2.5 mg total) by mouth daily. 90 tablet 3   No current facility-administered medications for this visit.    Socially, he is married for 25 years.  He has 2 children.  He works for [employer] with [employer].  As a master's degree.  He did previously smoke cigarettes for 20 years, but quit in 1995.  He does drink wine.  He does exercise at minimum 3 days per week.  Family History  Problem Relation Age of Onset  . Heart attack Maternal Grandfather    ROS General: Negative; No fevers, chills, or night sweats;  HEENT: Negative; No changes in vision or hearing, sinus congestion, difficulty swallowing Pulmonary: Negative; No cough, wheezing, shortness of breath, hemoptysis Cardiovascular: See history of present illness GI: Negative; No nausea, vomiting, diarrhea, or abdominal pain GU: He has had some issues with decreased libido. Musculoskeletal: Negative; no myalgias,  joint pain, or weakness Hematologic/Oncology: Negative; no easy bruising, bleeding Endocrine: Negative; no heat/cold intolerance; no diabetes Neuro: Negative; no changes in balance, headaches Skin: Negative; No rashes or skin lesions Psychiatric: Negative; No behavioral problems, depression Sleep: Negative; No snoring, daytime sleepiness, hypersomnolence, bruxism, restless legs, hypnogognic hallucinations, no cataplexy Other comprehensive 14 point system review is negative.  PE BP 131/74 mmHg  Pulse 48  Ht 5' 8" (1.727  m)  Wt 193 lb 6 oz (87.714 kg)  BMI 29.41 kg/m2   Wt Readings from Last 3 Encounters:  03/14/16 193 lb 6 oz (87.714 kg)  02/16/16 193 lb (87.544 kg)  10/03/15 190 lb (86.183 kg)   General: Alert, oriented, no distress.  Skin: normal turgor, no rashes; warm and dry HEENT: Normocephalic, atraumatic. Pupils round and reactive; sclera anicteric; extraocular muscles intact; Fundi within normal limits Nose without nasal septal hypertrophy Mouth/Parynx benign; Mallinpatti scale 2 Neck: No JVD, no carotid bruits; normal carotid upstroke Lungs: clear to ausculatation and percussion; no wheezing or rales Chest wall: without tenderness to palpitation Heart: RRR, s1 s2 normal , 9-4/5 systolic murmur heard at the mid and right and left sternal border, unchanged; no S3 or S4 gallop.  No rubs thrills or heaves Abdomen: soft, nontender; no hepatosplenomehaly, BS+; abdominal aorta nontender and not dilated by palpation. Back: no CVA tenderness Pulses 2+ Extremities: no clubbinbg cyanosis or edema, Homan's sign negative  Neurologic: grossly nonfocal; Cranial nerves grossly wnl Psychologic: Normal mood and affect  No ECG done today due to recent stress test  ECG (independently read by me): Sinus bradycardia at 49 bpm with first-degree AV block.  PR interval 212 ms.  QTc interval 433 ms.  Incomplete right bundle branch block.  March 2015 ECG (independently read by me): Sinus bradycardia 53 beats per minute.  No ectopy.  No significant change from his prior ECG from September 2014  LABS: I personally reviewed.  Blood work done by Dr. however, the at [facility] on 10/12/2015. BUN/creatinine 15/1.0.  Hemoglobin/hematocrit 14.8/44.5 Total cholesterol 145, triglycerides 65, HDL 40, LDL 92.  BMET  BMP Latest Ref Rng 08/13/2013 05/08/2008 05/07/2008  Glucose 70 - 99 mg/dL 105(H) 101(H) 121(H)  BUN 6 - 23 mg/dL 18 21 24(H)  Creatinine 0.50 - 1.35 mg/dL 1.02 0.95 1.03  Sodium 135 - 145  mEq/L 143 136 137  Potassium 3.5 - 5.3 mEq/L 4.6 3.7 3.9  Chloride 96 - 112 mEq/L 109 104 105  CO2 19 - 32 mEq/L _0 Calcium 8.4 - 10.5 mg/dL 9.1 7.6(L) 7.7(L)    Hepatic Function Latest Ref Rng 08/13/2013 05/05/2008  Total Protein 6.0 - 8.3 g/dL 6.8 6.5  Albumin 3.5 - 5.2 g/dL 4.1 3.3(L)  AST 0 - 37 U/L 25 31  ALT 0 - 53 U/L 23 32  Alk Phosphatase 39 - 117 U/L 51 43  Total Bilirubin 0.3 - 1.2 mg/dL 0.7 0.9     CBC Latest Ref Rng 08/13/2013 05/08/2008 05/07/2008  WBC 4.0 - 10.5 K/uL 5.6 7.9 7.6  Hemoglobin 13.0 - 17.0 g/dL 15.1 8.0(L) 8.4(L)  Hematocrit 39.0 - 52.0 % 43.1 23.3(L) 23.5(L)  Platelets 150 - 400 K/uL 156 110(L) 69 DELTA CHECK NOTED(L)    Lab Results  Component Value Date   MCV 93.3 08/13/2013   MCV 93.8 05/08/2008   MCV 93.6 05/07/2008   Lab Results  Component Value Date   TSH 2.531 08/13/2013    BNP    Component Value Date/Time   PROBNP <  30.0 05/01/2008 0420     Lipid Panel     Component Value Date/Time   CHOL 132 08/13/2013 0829   TRIG 72 08/13/2013 0829   HDL 44 08/13/2013 0829   CHOLHDL 3.0 08/13/2013 0829   VLDL 14 08/13/2013 0829   LDLCALC 74 08/13/2013 0829     RADIOLOGY: No results found.   ASSESSMENT AND PLAN: Mr. Trig is a 75-year-old gentleman who suffered a NSTEMI  in June 2009 and underwent CABG surgery x5 by Dr. Bartle.  A nuclear perfusion study in October 2013 continued to show normal perfusion without scar or ischemia.  He recently underwent a four-year follow-up nuclear perfusion study.  I reviewed this with him.  The present study showed essentially normal perfusion, although minimal apical lateral defect was present.  This most likely is artifact.  There was no reversible ischemia.  Ejection fraction was 55% with normal wall motion.  He continues to be clinically stable on his current medical regimen.  His blood pressure today is stable, but he remains bradycardic with a heart rate at 48 bpm.  I have suggested he reduce  his Bystolic from 5 mg to 2.5 mg daily.  He is on maximal dose Crestor 40 mg in addition to 500 mg of niacin.  His most recent lipid studies from Dr. Holwerda's office.  If his LDL continues to be elevated greater than 70 it may be worthwhile to consider adding Zetia 10 mg, but I will defer this to his primary physician.  He continues to take aspirin and is tolerating this well.  He remains active and is exercising at least 3 times per week doing both cardio with elliptical and treadmill walking.  I will see him in one year for cardiology reevaluation.  Time spent: 25 minutes Thomas A. Kelly, MD, FACC 03/15/2016 12:06 PM 

## 2016-03-15 NOTE — Telephone Encounter (Signed)
New Message   Pt c/o medication issue: 1. Name of Medication: nebivolol (BYSTOLIC) 5 MG tablet  4. What is your medication issue? Pt called to discuss the dosage.

## 2016-03-16 ENCOUNTER — Other Ambulatory Visit: Payer: Self-pay | Admitting: Cardiovascular Disease

## 2016-03-16 NOTE — Telephone Encounter (Signed)
crestor refilled bystolic refilled [id number] - dose decreased

## 2016-03-26 DIAGNOSIS — L821 Other seborrheic keratosis: Secondary | ICD-10-CM | POA: Diagnosis not present

## 2016-03-26 DIAGNOSIS — D1801 Hemangioma of skin and subcutaneous tissue: Secondary | ICD-10-CM | POA: Diagnosis not present

## 2016-03-26 DIAGNOSIS — D2262 Melanocytic nevi of left upper limb, including shoulder: Secondary | ICD-10-CM | POA: Diagnosis not present

## 2016-03-26 DIAGNOSIS — D225 Melanocytic nevi of trunk: Secondary | ICD-10-CM | POA: Diagnosis not present

## 2016-03-26 DIAGNOSIS — L438 Other lichen planus: Secondary | ICD-10-CM | POA: Diagnosis not present

## 2016-10-22 DIAGNOSIS — R351 Nocturia: Secondary | ICD-10-CM | POA: Diagnosis not present

## 2016-10-22 DIAGNOSIS — Z125 Encounter for screening for malignant neoplasm of prostate: Secondary | ICD-10-CM | POA: Diagnosis not present

## 2016-10-22 DIAGNOSIS — E784 Other hyperlipidemia: Secondary | ICD-10-CM | POA: Diagnosis not present

## 2016-10-31 DIAGNOSIS — Z6828 Body mass index (BMI) 28.0-28.9, adult: Secondary | ICD-10-CM | POA: Diagnosis not present

## 2016-10-31 DIAGNOSIS — R972 Elevated prostate specific antigen [PSA]: Secondary | ICD-10-CM | POA: Diagnosis not present

## 2016-10-31 DIAGNOSIS — I25728 Atherosclerosis of autologous artery coronary artery bypass graft(s) with other forms of angina pectoris: Secondary | ICD-10-CM | POA: Diagnosis not present

## 2016-10-31 DIAGNOSIS — E784 Other hyperlipidemia: Secondary | ICD-10-CM | POA: Diagnosis not present

## 2016-10-31 DIAGNOSIS — Z23 Encounter for immunization: Secondary | ICD-10-CM | POA: Diagnosis not present

## 2016-10-31 DIAGNOSIS — Z0000 Encounter for general adult medical examination without abnormal findings: Secondary | ICD-10-CM | POA: Diagnosis not present

## 2016-10-31 DIAGNOSIS — Z1389 Encounter for screening for other disorder: Secondary | ICD-10-CM | POA: Diagnosis not present

## 2016-11-17 DIAGNOSIS — Z23 Encounter for immunization: Secondary | ICD-10-CM | POA: Diagnosis not present

## 2016-11-25 DIAGNOSIS — J069 Acute upper respiratory infection, unspecified: Secondary | ICD-10-CM | POA: Diagnosis not present

## 2017-01-04 DIAGNOSIS — R972 Elevated prostate specific antigen [PSA]: Secondary | ICD-10-CM | POA: Diagnosis not present

## 2017-02-19 ENCOUNTER — Other Ambulatory Visit: Payer: Self-pay | Admitting: *Deleted

## 2017-02-19 DIAGNOSIS — E785 Hyperlipidemia, unspecified: Secondary | ICD-10-CM | POA: Diagnosis not present

## 2017-02-19 DIAGNOSIS — Z79899 Other long term (current) drug therapy: Secondary | ICD-10-CM | POA: Diagnosis not present

## 2017-02-19 DIAGNOSIS — I2510 Atherosclerotic heart disease of native coronary artery without angina pectoris: Secondary | ICD-10-CM

## 2017-02-19 LAB — LIPID PANEL
Cholesterol: 132 mg/dL (ref <200)
HDL: 44 mg/dL (ref >40)
LDL Cholesterol: 74 mg/dL (ref <100)
Total CHOL/HDL Ratio: 3 Ratio (ref <5.0)
Triglycerides: 71 mg/dL (ref <150)
VLDL: 14 mg/dL (ref <30)

## 2017-02-19 LAB — COMPREHENSIVE METABOLIC PANEL
ALT: 24 U/L (ref 9–46)
AST: 29 U/L (ref 10–35)
Albumin: 3.9 g/dL (ref 3.6–5.1)
Alkaline Phosphatase: 52 U/L (ref 40–115)
BUN: 15 mg/dL (ref 7–25)
CO2: 27 mmol/L (ref 20–31)
Calcium: 9.1 mg/dL (ref 8.6–10.3)
Chloride: 108 mmol/L (ref 98–110)
Creat: 1.07 mg/dL (ref 0.70–1.18)
Glucose, Bld: 97 mg/dL (ref 65–99)
Potassium: 4.8 mmol/L (ref 3.5–5.3)
Sodium: 141 mmol/L (ref 135–146)
Total Bilirubin: 0.8 mg/dL (ref 0.2–1.2)
Total Protein: 6.7 g/dL (ref 6.1–8.1)

## 2017-02-19 LAB — CBC
HCT: 44.3 % (ref 38.5–50.0)
Hemoglobin: 14.8 g/dL (ref 13.2–17.1)
MCH: 32 pg (ref 27.0–33.0)
MCHC: 33.4 g/dL (ref 32.0–36.0)
MCV: 95.9 fL (ref 80.0–100.0)
MPV: 12.1 fL (ref 7.5–12.5)
Platelets: 169 10*3/uL (ref 140–400)
RBC: 4.62 MIL/uL (ref 4.20–5.80)
RDW: 13.3 % (ref 11.0–15.0)
WBC: 5.6 10*3/uL (ref 3.8–10.8)

## 2017-02-19 LAB — TSH: TSH: 2.14 mIU/L (ref 0.40–4.50)

## 2017-02-25 ENCOUNTER — Encounter: Payer: Self-pay | Admitting: Cardiovascular Disease

## 2017-02-25 ENCOUNTER — Ambulatory Visit (INDEPENDENT_AMBULATORY_CARE_PROVIDER_SITE_OTHER): Payer: Medicare Other | Admitting: Cardiovascular Disease

## 2017-02-25 VITALS — BP 130/70 | HR 52 | Ht 68.0 in | Wt 181.0 lb

## 2017-02-25 DIAGNOSIS — E785 Hyperlipidemia, unspecified: Secondary | ICD-10-CM

## 2017-02-25 DIAGNOSIS — I10 Essential (primary) hypertension: Secondary | ICD-10-CM

## 2017-02-25 DIAGNOSIS — R001 Bradycardia, unspecified: Secondary | ICD-10-CM

## 2017-02-25 DIAGNOSIS — I2510 Atherosclerotic heart disease of native coronary artery without angina pectoris: Secondary | ICD-10-CM | POA: Diagnosis not present

## 2017-02-25 DIAGNOSIS — I440 Atrioventricular block, first degree: Secondary | ICD-10-CM

## 2017-02-25 NOTE — Progress Notes (Signed)
Patient ID: Carlos [name], male   DOB: 11-02-1941, 76 y.o.   MRN: [id number]      Primary M.D.: Dr. Loleta Books  HPI: Carlos [name] is a 76 year old gentleman was born in [place], [place], and works for [employer].  He is a former patient of Dr. Rollene Fare.  He presents for one-year follow-up evaluation.   Carlos [name] has has CAD and in June 2009 underwent CABG surgery x5 by Dr. Gilford Raid after presenting with a non-ST segment elevation myocardial infarction. A LIMA was placed to the LAD, a  SVG to the second diagonal branch of the LAD, sequential vein graft to the first and second obtuse marginal branches of the circumflex coronary artery, and vein graft to the PDA branch of the RCA with endoscopic vein harvesting from both thighs.  He is not undergone cardiac catheterizations since that time.  His last nuclear perfusion study in October 2013 did not show any ischemia. In October 2014, he underwent carotid duplex imaging, which showed mild plaque without significant stenoses, bilaterally.  Laboratory in September 2014 showed a TC 132, triglycerides 72, LDL 74, HDL 44 on  Crestor 40 mg in addition to Niaspan 500 mg.  There was no evidence for anemia.  Glucose was minimally increased at 153, TSH was normal as was his PSA.  An echo Doppler study in October 2014 showed an ejection fraction of 60-65% with normal wall motion.  He retired from [employer] in November 2016 and continues to be active.  A nuclear stress test in 02/22/2016 was low risk and had mild attenuation artifact in the apical lateral segment.  There was no ischemia.  Ejection fraction was 55% with normal wall motion.    Since [name] one year ago, he has continued to be active.  He typically walks between 2 and 4 miles at least 3 days per week and on other days does half hour of cardio including rowing and treadmill in addition to some low resistant exercise.  He denies any episodes of chest pressure.  He is unaware of tachycardia.  He  denies shortness of breath.  He specifically denies any presyncope or syncope and is unaware of palpitations.    He had recent laboratory on 02/19/2017.  CBC, TSH, and chemistry profile were normal.  His lipid studies are excellent and total cholesterol 132, triglycerides 71, HDL 44, VLDL 14, and LDL 74.  He presents for evaluation.  Past surgical history is notable for his CABG surgery, tonsillectomy, and tear duct surgery.   Current Outpatient Prescriptions  Medication Sig Dispense Refill  . aspirin 81 MG tablet Take 81 mg by mouth daily.    . Multiple Vitamin (MULTIVITAMIN WITH MINERALS) TABS tablet Take 1 tablet by mouth daily.    . nebivolol (BYSTOLIC) 2.5 MG tablet Take 1 tablet (2.5 mg total) by mouth daily. 90 tablet 3  . Omega 3-6-9 Fatty Acids (OMEGA 3-6-9 COMPLEX) CAPS Take 1 capsule by mouth daily.    . rosuvastatin (CRESTOR) 40 MG tablet Take 1 tablet (40 mg total) by mouth daily. 90 tablet 3   No current facility-administered medications for this visit.     Socially, he is married for 25 years.  He has 2 children.  He works for [employer] with [employer].  As a master's degree.  He did previously smoke cigarettes for 20 years, but quit in 1995.  He does drink wine.  He does exercise at minimum 3 days per week.  Family History  Problem Relation Age of Onset  . Heart attack Maternal Grandfather    ROS General: Negative; No fevers, chills, or night sweats;  HEENT: Negative; No changes in vision or hearing, sinus congestion, difficulty swallowing Pulmonary: Negative; No cough, wheezing, shortness of breath, hemoptysis Cardiovascular: See history of present illness GI: Negative; No nausea , vomiting, diarrhea, or abdominal pain GU: He has had some issues with decreased libido. Musculoskeletal: Negative; no myalgias, joint pain, or weakness Hematologic/Oncology: Negative; no easy bruising, bleeding Endocrine: Negative; no heat/cold intolerance; no diabetes Neuro: Negative; no  changes in balance, headaches Skin: Negative; No rashes or skin lesions Psychiatric: Negative; No behavioral problems, depression Sleep: Negative; No snoring, daytime sleepiness, hypersomnolence, bruxism, restless legs, hypnogognic hallucinations, no cataplexy Other comprehensive 14 point system review is negative.  PE BP 130/70   Pulse (!) 52   Ht _0  (1.727 m)   Wt 181 lb (82.1 kg)   BMI 27.52 kg/m    Repeat blood pressure 122/70  Wt Readings from Last 3 Encounters:  02/25/17 181 lb (82.1 kg)  03/14/16 193 lb 6 oz (87.7 kg)  02/16/16 193 lb (87.5 kg)   General: Alert, oriented, no distress.  Skin: normal turgor, no rashes; warm and dry HEENT: Normocephalic, atraumatic. Pupils round and reactive; sclera anicteric; extraocular muscles intact; Fundi within normal limits Nose without nasal septal hypertrophy Mouth/Parynx benign; Mallinpatti scale 2 Neck: No JVD, no carotid bruits; normal carotid upstroke Lungs: clear to ausculatation and percussion; no wheezing or rales Chest wall: without tenderness to palpitation Heart: RRR, s1 s2 normal , 8-9/2 systolic murmur heard at the mid and right and left sternal border, unchanged; no S3 or S4 gallop.  No rubs thrills or heaves Abdomen: soft, nontender; no hepatosplenomehaly, BS+; abdominal aorta nontender and not dilated by palpation. Back: no CVA tenderness Pulses 2+ Extremities: no clubbinbg cyanosis or edema, Homan's sign negative  Neurologic: grossly nonfocal; Cranial nerves grossly wnl Psychologic: Normal mood and affect  ECG (independently read by me): Sinus bradycardia 53 bpm.  Mild RV conduction delay.  Borderline first-degree AV block with a PR interval 206.  We seconds.  ECG (independently read by me): Sinus bradycardia at 49 bpm with first-degree AV block.  PR interval 212 ms.  QTc interval 433 ms.  Incomplete right bundle branch block.  March 2015 ECG (independently read by me): Sinus bradycardia 53 beats per minute.   No ectopy.  No significant change from his prior ECG from September 2014  LABS: Blood work at [facility] on 10/12/2015. BUN/creatinine 15/1.0.  Hemoglobin/hematocrit 14.8/44.5 Total cholesterol 145, triglycerides 65, HDL 40, LDL 92.  BMET  BMP Latest Ref Rng & Units 02/19/2017 08/13/2013 05/08/2008  Glucose 65 - 99 mg/dL 97 105(H) 101(H)  BUN 7 - 25 mg/dL _1 Creatinine 0.70 - 1.18 mg/dL 1.07 1.02 0.95  Sodium 135 - 146 mmol/L 141 143 136  Potassium 3.5 - 5.3 mmol/L 4.8 4.6 3.7  Chloride 98 - 110 mmol/L 108 109 104  CO2 20 - 31 mmol/L _2 Calcium 8.6 - 10.3 mg/dL 9.1 9.1 7.6(L)    Hepatic Function Latest Ref Rng & Units 02/19/2017 08/13/2013 05/05/2008  Total Protein 6.1 - 8.1 g/dL 6.7 6.8 6.5  Albumin 3.6 - 5.1 g/dL 3.9 4.1 3.3(L)  AST 10 - 35 U/L _3 ALT 9 - 46 U/L 24 23 32  Alk Phosphatase 40 - 115 U/L 52 51 43  Total Bilirubin 0.2 - 1.2 mg/dL 0.8  0.7 0.9     CBC Latest Ref Rng & Units 02/19/2017 08/13/2013 05/08/2008  WBC 3.8 - 10.8 K/uL 5.6 5.6 7.9  Hemoglobin 13.2 - 17.1 g/dL 14.8 15.1 8.0(L)  Hematocrit 38.5 - 50.0 % 44.3 43.1 23.3(L)  Platelets 140 - 400 K/uL 169 156 110(L)    Lab Results  Component Value Date   MCV 95.9 02/19/2017   MCV 93.3 08/13/2013   MCV 93.8 05/08/2008   Lab Results  Component Value Date   TSH 2.14 02/19/2017    BNP    Component Value Date/Time   PROBNP <30.0 05/01/2008 0420     Lipid Panel     Component Value Date/Time   CHOL 132 02/19/2017 0857   TRIG 71 02/19/2017 0857   HDL 44 02/19/2017 0857   CHOLHDL 3.0 02/19/2017 0857   VLDL 14 02/19/2017 0857   LDLCALC 74 02/19/2017 0857     RADIOLOGY: No results found.  IMPRESSION:  1. Coronary artery disease involving native coronary artery of native heart without angina pectoris   2. Sinus bradycardia on ECG   3. Hyperlipidemia with target LDL less than 70   4. Essential hypertension   5. First degree AV block     ASSESSMENT AND PLAN: Mr.  [name] is a 76 year old gentleman who suffered a NSTEMI  in June 2009 and underwent CABG surgery x5 by Dr. Cyndia Bent.  A nuclear perfusion study in October 2013 continued to show normal perfusion without scar or ischemia. A four-year follow-up study in April 2017 showed essentially normal perfusion, although minimal apical lateral defect was present.  This most likely is artifact.  There was no reversible ischemia.  Ejection fraction was 55% with normal wall motion.  He continues to be clinically stable on his current medical regimen.  His blood pressure today is stable and he continues to take low-dose Bystolic at 2.5 mg.  He has mild bradycardia and is asymptomatic with borderline first-degree AV block.  He is exercising regularly and typically walks on average 3 miles at a time.  He has continued to be on rosuvastatin 40 mg in addition to niacin 500 mg.  I reviewed his most recent lipid studies.  I have suggested he discontinue niacin since he now has had persistent normal triglycerides and his HDL levels have been persistently in the 40s.  Remotely, they had been low at 32.  He also is taking over-the-counter Krill oil.  He will be seeing Dr. Niel Hummer  in 6 months and repeat blood work will be obtained at that time.  If his LDL increases, I would consider adding Zetia 10 mg to rosuvastatin for more optimal LDL lowering.  I commended him on his weight loss from one year ago approximate 12 pounds.  As long as she remains stable, I will see him in one year for cardiology reassessment.  Time spent: 25 minutes Troy Sine, MD, [facility] 02/25/2017 12:41 PM

## 2017-02-25 NOTE — Patient Instructions (Signed)
Your physician has recommended you make the following change in your medication:   1.) STOP the niacin.  Your physician wants you to follow-up in: 1 year or sooner if needed. You will receive a reminder letter in the mail two months in advance. If you don't receive a letter, please call our office to schedule the follow-up appointment.   If you need a refill on your cardiac medications before your next appointment, please call your pharmacy.

## 2017-03-11 ENCOUNTER — Other Ambulatory Visit: Payer: Self-pay | Admitting: Cardiovascular Disease

## 2017-03-13 ENCOUNTER — Telehealth: Payer: Self-pay | Admitting: *Deleted

## 2017-03-13 ENCOUNTER — Other Ambulatory Visit: Payer: Self-pay | Admitting: *Deleted

## 2017-03-13 NOTE — Telephone Encounter (Signed)
Patient notified of results. He also requested that his prescriptions be refilled to [facility] on [facility].

## 2017-03-13 NOTE — Telephone Encounter (Signed)
-----   Message from Troy Sine, MD sent at 03/10/2017  9:36 PM EDT ----- Labs wnl

## 2017-03-13 NOTE — Telephone Encounter (Signed)
opened chart to refill patient's medications. They were already done on 03/11/17.

## 2017-09-08 DIAGNOSIS — Z23 Encounter for immunization: Secondary | ICD-10-CM | POA: Diagnosis not present

## 2017-10-29 DIAGNOSIS — R82998 Other abnormal findings in urine: Secondary | ICD-10-CM | POA: Diagnosis not present

## 2017-10-29 DIAGNOSIS — Z125 Encounter for screening for malignant neoplasm of prostate: Secondary | ICD-10-CM | POA: Diagnosis not present

## 2017-10-29 DIAGNOSIS — E7849 Other hyperlipidemia: Secondary | ICD-10-CM | POA: Diagnosis not present

## 2017-11-05 DIAGNOSIS — I25728 Atherosclerosis of autologous artery coronary artery bypass graft(s) with other forms of angina pectoris: Secondary | ICD-10-CM | POA: Diagnosis not present

## 2017-11-05 DIAGNOSIS — M1990 Unspecified osteoarthritis, unspecified site: Secondary | ICD-10-CM | POA: Diagnosis not present

## 2017-11-05 DIAGNOSIS — E7849 Other hyperlipidemia: Secondary | ICD-10-CM | POA: Diagnosis not present

## 2017-11-05 DIAGNOSIS — Z0000 Encounter for general adult medical examination without abnormal findings: Secondary | ICD-10-CM | POA: Diagnosis not present

## 2017-11-05 DIAGNOSIS — Z6827 Body mass index (BMI) 27.0-27.9, adult: Secondary | ICD-10-CM | POA: Diagnosis not present

## 2017-11-05 DIAGNOSIS — Z1389 Encounter for screening for other disorder: Secondary | ICD-10-CM | POA: Diagnosis not present

## 2017-11-05 DIAGNOSIS — Z23 Encounter for immunization: Secondary | ICD-10-CM | POA: Diagnosis not present

## 2017-11-05 DIAGNOSIS — R972 Elevated prostate specific antigen [PSA]: Secondary | ICD-10-CM | POA: Diagnosis not present

## 2018-02-12 ENCOUNTER — Ambulatory Visit: Payer: Medicare Other | Admitting: Cardiovascular Disease

## 2018-03-11 ENCOUNTER — Telehealth: Payer: Self-pay | Admitting: Cardiovascular Disease

## 2018-03-11 MED ORDER — NEBIVOLOL HCL 2.5 MG PO TABS: 2.5000 mg | ORAL_TABLET | Freq: Every day | ORAL | 0 refills | Status: DC

## 2018-03-11 MED ORDER — ROSUVASTATIN CALCIUM 40 MG PO TABS: 40.0000 mg | ORAL_TABLET | Freq: Every day | ORAL | 0 refills | Status: DC

## 2018-03-11 NOTE — Telephone Encounter (Signed)
New message     *STAT* If patient is at the pharmacy, call can be transferred to refill team.   1. Which medications need to be refilled? (please list name of each medication and dose if known) rosuvastatin (CRESTOR) 40 MG tablet and BYSTOLIC 2.5 MG tablet  2. Which pharmacy/location (including street and city if local pharmacy) is medication to be sent to?[id number] - [place], [place] [facility] [address]. [address]  3. Do they need a 30 day or 90 day supply? Carlos [name]

## 2018-03-15 ENCOUNTER — Other Ambulatory Visit: Payer: Self-pay | Admitting: Cardiovascular Disease

## 2018-05-06 ENCOUNTER — Encounter: Payer: Self-pay | Admitting: Cardiovascular Disease

## 2018-05-06 ENCOUNTER — Ambulatory Visit (INDEPENDENT_AMBULATORY_CARE_PROVIDER_SITE_OTHER): Payer: Medicare Other | Admitting: Cardiovascular Disease

## 2018-05-06 VITALS — BP 128/76 | HR 50 | Ht 68.0 in | Wt 177.8 lb

## 2018-05-06 DIAGNOSIS — E785 Hyperlipidemia, unspecified: Secondary | ICD-10-CM

## 2018-05-06 DIAGNOSIS — I2510 Atherosclerotic heart disease of native coronary artery without angina pectoris: Secondary | ICD-10-CM

## 2018-05-06 DIAGNOSIS — I10 Essential (primary) hypertension: Secondary | ICD-10-CM | POA: Diagnosis not present

## 2018-05-06 DIAGNOSIS — Z79899 Other long term (current) drug therapy: Secondary | ICD-10-CM

## 2018-05-06 MED ORDER — EZETIMIBE 10 MG PO TABS: 10.0000 mg | ORAL_TABLET | Freq: Every day | ORAL | 3 refills | Status: DC

## 2018-05-06 MED ORDER — ROSUVASTATIN CALCIUM 40 MG PO TABS: 40.0000 mg | ORAL_TABLET | Freq: Every day | ORAL | 3 refills | Status: DC

## 2018-05-06 MED ORDER — NEBIVOLOL HCL 2.5 MG PO TABS: 2.5000 mg | ORAL_TABLET | Freq: Every day | ORAL | 3 refills | Status: DC

## 2018-05-06 NOTE — Progress Notes (Signed)
Patient ID: CLIDE REMMERS, male   DOB: 10-Sep-1941, 77 y.o.   MRN: [id number]      Primary M.D.: Dr. Loleta Books  HPI: Mr. Artist is a 77 year old gentleman was born in [place], [place], and worked for [employer].  He is a former patient of Dr. Rollene Fare.  [name] in April 2018. He presents for 79-monthfollow-up evaluation.   Mr. GBroughtonhas has CAD and in June 2009 underwent CABG surgery x5 by Dr. BGilford Raidafter presenting with a non-ST segment elevation myocardial infarction. A LIMA was placed to the LAD, a  SVG to the second diagonal branch of the LAD, sequential vein graft to the first and second obtuse marginal branches of the circumflex coronary artery, and vein graft to the PDA branch of the RCA with endoscopic vein harvesting from both thighs.  He is not undergone cardiac catheterizations since that time.  His last nuclear perfusion study in October 2013 did not show any ischemia. In October 2014, he underwent carotid duplex imaging, which showed mild plaque without significant stenoses, bilaterally.  Laboratory in September 2014 showed a TC 132, triglycerides 72, LDL 74, HDL 44 on  Crestor 40 mg in addition to Niaspan 500 mg.  There was no evidence for anemia.  Glucose was minimally increased at 153, TSH was normal as was his PSA.  An echo Doppler study in October 2014 showed an ejection fraction of 60-65% with normal wall motion.  He retired from [employer] 2016 and continues to be active.  A nuclear stress test in 02/22/2016 was low risk and had mild attenuation artifact in the apical lateral segment.  There was no ischemia.  Ejection fraction was 55% with normal wall motion.    Operatory from February 19, 2017 was normal.  Lipid studies were excellent with cholesterol 132, triglycerides 71, HDL 44, VLDL 14, and LDL 74.  Since [name] in April 2018, he denies any episodes of chest pain.  He continues to be active.  He walks typically 2 to 3 miles a day and exercises in a  gym at least 3 days/week both doing cardiac and resistance training.  [facility]  Had undergone laboratory in December 2018 which showed a total cholesterol 155, HDL 43, LDL 94, triglycerides 89.  Is been maintained on rosuvastatin 40 mg in addition to nebivolol 2.5 mg and aspirin 81 mg.  He denies any chest pain, palpitations, PND orthopnea, presyncope or syncope.  He presents for evaluation.  Past surgical history is notable for his CABG surgery, tonsillectomy, and tear duct surgery.   Current Outpatient Medications  Medication Sig Dispense Refill  . aspirin 81 MG tablet Take 81 mg by mouth daily.    . Coenzyme Q10 (CO Q-10) 100 MG CAPS Take 1 tablet by mouth daily.    . Multiple Vitamin (MULTIVITAMIN WITH MINERALS) TABS tablet Take 1 tablet by mouth daily.    . nebivolol (BYSTOLIC) 2.5 MG tablet Take 1 tablet (2.5 mg total) by mouth daily. 90 tablet 3  . rosuvastatin (CRESTOR) 40 MG tablet Take 1 tablet (40 mg total) by mouth daily. 90 tablet 3  . ezetimibe (ZETIA) 10 MG tablet Take 1 tablet (10 mg total) by mouth daily. 90 tablet 3   No current facility-administered medications for this visit.     Socially, he is married for 27 years.  He has 2 children.  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  He did previously smoke cigarettes for 20 years, but quit in 1995.  He does drink wine.  He does exercise at minimum 3 days per week.  Family History  Problem Relation Age of Onset  . Heart attack Maternal Grandfather    ROS General: Negative; No fevers, chills, or night sweats;  HEENT: Negative; No changes in vision or hearing, sinus congestion, difficulty swallowing Pulmonary: Negative; No cough, wheezing, shortness of breath, hemoptysis Cardiovascular: See history of present illness GI: Negative; No nausea , vomiting, diarrhea, or abdominal pain GU: He has had some issues with decreased libido. Musculoskeletal: Negative; no myalgias, joint pain, or  weakness Hematologic/Oncology: Negative; no easy bruising, bleeding Endocrine: Negative; no heat/cold intolerance; no diabetes Neuro: Negative; no changes in balance, headaches Skin: Negative; No rashes or skin lesions Psychiatric: Negative; No behavioral problems, depression Sleep: Negative; No snoring, daytime sleepiness, hypersomnolence, bruxism, restless legs, hypnogognic hallucinations, no cataplexy Other comprehensive 14 point system review is negative.  PE BP 128/76   Pulse (!) 50   Ht '5\' 8"'$  (1.727 m)   Wt 177 lb 12.8 oz (80.6 kg)   BMI 27.03 kg/m    Repeat blood pressure 124/74  Wt Readings from Last 3 Encounters:  05/06/18 177 lb 12.8 oz (80.6 kg)  02/25/17 181 lb (82.1 kg)  03/14/16 193 lb 6 oz (87.7 kg)   General: Alert, oriented, no distress.  Skin: normal turgor, no rashes, warm and dry HEENT: Normocephalic, atraumatic. Pupils equal round and reactive to light; sclera anicteric; extraocular muscles intact;  Nose without nasal septal hypertrophy Mouth/Parynx benign; Mallinpatti scale 3 Neck: No JVD, no carotid bruits; normal carotid upstroke Lungs: clear to ausculatation and percussion; no wheezing or rales Chest wall: without tenderness to palpitation Heart: PMI not displaced, RRR, s1 s2 normal, 9-6/7 systolic murmur, no diastolic murmur, no rubs, gallops, thrills, or heaves Abdomen: soft, nontender; no hepatosplenomehaly, BS+; abdominal aorta nontender and not dilated by palpation. Back: no CVA tenderness Pulses 2+ Musculoskeletal: full range of motion, normal strength, no joint deformities Extremities: no clubbing cyanosis or edema, Homan's sign negative  Neurologic: grossly nonfocal; Cranial nerves grossly wnl Psychologic: Normal mood and affect  ECG (independently read by me): This bradycardia 50 bpm.  First-degree AV block with a PR interval of 218 ms.  RV conduction delay.  February 25, 2017 ECG (independently read by me): Sinus bradycardia 53 bpm.  Mild RV  conduction delay.  Borderline first-degree AV block with a PR interval 206.  We seconds.  April 2017 ECG (independently read by me): Sinus bradycardia at 49 bpm with first-degree AV block.  PR interval 212 ms.  QTc interval 433 ms.  Incomplete right bundle branch block.  March 2015 ECG (independently read by me): Sinus bradycardia 53 beats per minute.  No ectopy.  No significant change from his prior ECG from September 2014  LABS: Blood work at [facility] on 10/12/2015. BUN/creatinine 15/1.0.  Hemoglobin/hematocrit 14.8/44.5 Total cholesterol 145, triglycerides 65, HDL 40, LDL 92.  BMET  BMP Latest Ref Rng & Units 02/19/2017 08/13/2013 05/08/2008  Glucose 65 - 99 mg/dL 97 105(H) 101(H)  BUN 7 - 25 mg/dL '15 18 21  '$ Creatinine 0.70 - 1.18 mg/dL 1.07 1.02 0.95  Sodium 135 - 146 mmol/L 141 143 136  Potassium 3.5 - 5.3 mmol/L 4.8 4.6 3.7  Chloride 98 - 110 mmol/L 108 109 104  CO2 20 - 31 mmol/L '27 27 25  '$ Calcium 8.6 - 10.3 mg/dL 9.1 9.1 7.6(L)  Hepatic Function Latest Ref Rng & Units 02/19/2017 08/13/2013 05/05/2008  Total Protein 6.1 - 8.1 g/dL 6.7 6.8 6.5  Albumin 3.6 - 5.1 g/dL 3.9 4.1 3.3(L)  AST 10 - 35 U/L '29 25 31  '$ ALT 9 - 46 U/L 24 23 32  Alk Phosphatase 40 - 115 U/L 52 51 43  Total Bilirubin 0.2 - 1.2 mg/dL 0.8 0.7 0.9     CBC Latest Ref Rng & Units 02/19/2017 08/13/2013 05/08/2008  WBC 3.8 - 10.8 K/uL 5.6 5.6 7.9  Hemoglobin 13.2 - 17.1 g/dL 14.8 15.1 8.0(L)  Hematocrit 38.5 - 50.0 % 44.3 43.1 23.3(L)  Platelets 140 - 400 K/uL 169 156 110(L)    Lab Results  Component Value Date   MCV 95.9 02/19/2017   MCV 93.3 08/13/2013   MCV 93.8 05/08/2008   Lab Results  Component Value Date   TSH 2.14 02/19/2017    BNP    Component Value Date/Time   PROBNP <30.0 05/01/2008 0420     Lipid Panel     Component Value Date/Time   CHOL 132 02/19/2017 0857   TRIG 71 02/19/2017 0857   HDL 44 02/19/2017 0857   CHOLHDL 3.0 02/19/2017 0857   VLDL 14 02/19/2017  0857   LDLCALC 74 02/19/2017 0857     RADIOLOGY: No results found.  IMPRESSION:  1. Coronary artery disease involving native coronary artery of native heart without angina pectoris   2. Hyperlipidemia with target LDL less than 70   3. Essential hypertension   4. Medication management     ASSESSMENT AND PLAN: Mr. Kairee is a 77 year old gentleman who suffered a NSTEMI  in June 2009 and underwent CABG surgery x5 by Dr. Cyndia Bent.  A nuclear perfusion study in October 2013 continued to show normal perfusion without scar or ischemia. A four-year follow-up study in April 2017 showed essentially normal perfusion, although minimal apical lateral defect was present.  This most likely is artifact.  There was no reversible ischemia.  Ejection fraction was 55% with normal wall motion.  Mr. Shippy continues to be asymptomatic with reference to recurrent anginal symptomatology.  He is exercising regularly and remaining active.  His blood pressure today is stable on low-dose nebivolol 2.5 mg daily.  Resting pulse is bradycardic and he is asymptomatic.  I reviewed his most recent laboratory from December 2018.  LDL cholesterol was 94.  In this patient with established CAD and status post CABG surgery, I have recommended more aggressive treatment.  I will add Zetia 10 mg to his current dose of rosuvastatin 40 mg for more optimal lowering with target LDL less than 70.  I also had a lengthy discussion with him today regarding the Fourier trial with Repatha if he is unable to reach target.  He will undergo repeat chemistry and lipid studies in 2 to 3 months.  He will be seeing Dr. however the in the future.  I will review his laboratory when available.  I will see him in 1 year for reevaluation or sooner if problems arise.   Time spent: 25 minutes Troy Sine, MD, [facility] 05/08/2018 6:58 PM

## 2018-05-06 NOTE — Patient Instructions (Signed)
Medication Instructions:  START Zetia 10 mg daily  Labwork: Please return for FASTING labs in 2-3 months (CMET, Lipid)  Our in office lab hours are Monday-Friday 8:00-4:00, closed for lunch 12:45-1:45 pm.  No appointment needed.  Follow-Up: Your physician wants you to follow-up in: 12 months with Dr. Claiborne Billings.  You will receive a reminder letter in the mail two months in advance. If you don't receive a letter, please call our office to schedule the follow-up appointment.   Any Other Special Instructions Will Be Listed Below (If Applicable).     If you need a refill on your cardiac medications before your next appointment, please call your pharmacy.

## 2018-05-08 ENCOUNTER — Encounter: Payer: Self-pay | Admitting: Cardiovascular Disease

## 2018-07-07 DIAGNOSIS — H2513 Age-related nuclear cataract, bilateral: Secondary | ICD-10-CM | POA: Diagnosis not present

## 2018-07-24 ENCOUNTER — Encounter (INDEPENDENT_AMBULATORY_CARE_PROVIDER_SITE_OTHER): Payer: Self-pay

## 2018-07-25 DIAGNOSIS — I2510 Atherosclerotic heart disease of native coronary artery without angina pectoris: Secondary | ICD-10-CM | POA: Diagnosis not present

## 2018-07-25 DIAGNOSIS — E785 Hyperlipidemia, unspecified: Secondary | ICD-10-CM | POA: Diagnosis not present

## 2018-07-25 DIAGNOSIS — Z79899 Other long term (current) drug therapy: Secondary | ICD-10-CM | POA: Diagnosis not present

## 2018-07-25 LAB — COMPREHENSIVE METABOLIC PANEL
ALT: 22 IU/L (ref 0–44)
AST: 26 IU/L (ref 0–40)
Albumin/Globulin Ratio: 1.7 (ref 1.2–2.2)
Albumin: 4.1 g/dL (ref 3.5–4.8)
Alkaline Phosphatase: 53 IU/L (ref 39–117)
BUN/Creatinine Ratio: 17 (ref 10–24)
BUN: 20 mg/dL (ref 8–27)
Bilirubin Total: 0.7 mg/dL (ref 0.0–1.2)
CO2: 23 mmol/L (ref 20–29)
Calcium: 9 mg/dL (ref 8.6–10.2)
Chloride: 104 mmol/L (ref 96–106)
Creatinine, Ser: 1.15 mg/dL (ref 0.76–1.27)
GFR calc Af Amer: 70 mL/min/{1.73_m2} (ref >59)
GFR calc non Af Amer: 60 mL/min/{1.73_m2} (ref >59)
Globulin, Total: 2.4 g/dL (ref 1.5–4.5)
Glucose: 86 mg/dL (ref 65–99)
Potassium: 4.7 mmol/L (ref 3.5–5.2)
Sodium: 141 mmol/L (ref 134–144)
Total Protein: 6.5 g/dL (ref 6.0–8.5)

## 2018-07-25 LAB — LIPID PANEL
Chol/HDL Ratio: 2.5 ratio (ref 0.0–5.0)
Cholesterol, Total: 122 mg/dL (ref 100–199)
HDL: 49 mg/dL (ref >39)
LDL Calculated: 61 mg/dL (ref 0–99)
Triglycerides: 59 mg/dL (ref 0–149)
VLDL Cholesterol Cal: 12 mg/dL (ref 5–40)

## 2018-09-02 DIAGNOSIS — Z23 Encounter for immunization: Secondary | ICD-10-CM | POA: Diagnosis not present

## 2018-10-30 DIAGNOSIS — Z125 Encounter for screening for malignant neoplasm of prostate: Secondary | ICD-10-CM | POA: Diagnosis not present

## 2018-10-30 DIAGNOSIS — E7849 Other hyperlipidemia: Secondary | ICD-10-CM | POA: Diagnosis not present

## 2018-10-30 DIAGNOSIS — R82998 Other abnormal findings in urine: Secondary | ICD-10-CM | POA: Diagnosis not present

## 2018-10-31 DIAGNOSIS — E7849 Other hyperlipidemia: Secondary | ICD-10-CM | POA: Diagnosis not present

## 2018-11-06 DIAGNOSIS — Z0000 Encounter for general adult medical examination without abnormal findings: Secondary | ICD-10-CM | POA: Diagnosis not present

## 2018-11-06 DIAGNOSIS — Z6828 Body mass index (BMI) 28.0-28.9, adult: Secondary | ICD-10-CM | POA: Diagnosis not present

## 2018-11-06 DIAGNOSIS — I25728 Atherosclerosis of autologous artery coronary artery bypass graft(s) with other forms of angina pectoris: Secondary | ICD-10-CM | POA: Diagnosis not present

## 2018-11-06 DIAGNOSIS — E7849 Other hyperlipidemia: Secondary | ICD-10-CM | POA: Diagnosis not present

## 2018-11-06 DIAGNOSIS — Z1389 Encounter for screening for other disorder: Secondary | ICD-10-CM | POA: Diagnosis not present

## 2018-11-06 DIAGNOSIS — R972 Elevated prostate specific antigen [PSA]: Secondary | ICD-10-CM | POA: Diagnosis not present

## 2019-04-30 ENCOUNTER — Other Ambulatory Visit: Payer: Self-pay | Admitting: Cardiovascular Disease

## 2019-05-18 ENCOUNTER — Telehealth: Payer: Medicare Other | Admitting: Cardiovascular Disease

## 2019-05-21 ENCOUNTER — Encounter: Payer: Self-pay | Admitting: Physician Assistant

## 2019-05-21 ENCOUNTER — Telehealth (INDEPENDENT_AMBULATORY_CARE_PROVIDER_SITE_OTHER): Payer: Medicare Other | Admitting: Physician Assistant

## 2019-05-21 VITALS — BP 126/62 | HR 56 | Ht 68.0 in | Wt 185.0 lb

## 2019-05-21 DIAGNOSIS — I10 Essential (primary) hypertension: Secondary | ICD-10-CM

## 2019-05-21 DIAGNOSIS — E785 Hyperlipidemia, unspecified: Secondary | ICD-10-CM

## 2019-05-21 DIAGNOSIS — I2510 Atherosclerotic heart disease of native coronary artery without angina pectoris: Secondary | ICD-10-CM | POA: Diagnosis not present

## 2019-05-21 MED ORDER — ROSUVASTATIN CALCIUM 40 MG PO TABS: 40.0000 mg | ORAL_TABLET | Freq: Every day | ORAL | 3 refills | Status: DC

## 2019-05-21 MED ORDER — NEBIVOLOL HCL 2.5 MG PO TABS: 2.5000 mg | ORAL_TABLET | Freq: Every day | ORAL | 3 refills | Status: DC

## 2019-05-21 NOTE — Patient Instructions (Signed)
Medication Instructions:  Your physician recommends that you continue on your current medications as directed. Please refer to the Current Medication list given to you today.  If you need a refill on your cardiac medications before your next appointment, please call your pharmacy.    Follow-Up: At [facility], you and your health needs are our priority.  As part of our continuing mission to provide you with exceptional heart care, we have created designated Provider Care Teams.  These Care Teams include your primary Cardiologist (physician) and Advanced Practice Providers (APPs -  Physician Assistants and Nurse Practitioners) who all work together to provide you with the care you need, when you need it. You will need a follow up appointment in 6 months.  Please call our office 2 months in advance to schedule this appointment.  You may see Shelva Majestic, MD or one of the following Advanced Practice Providers on your designated Care Team: [place], [place] . Fabian Sharp, PA-C  Any Other Special Instructions Will Be Listed Below (If Applicable).  Fat and Cholesterol Restricted Eating Plan Getting too much fat and cholesterol in your diet may cause health problems. Choosing the right foods helps keep your fat and cholesterol at normal levels. This can keep you from getting certain diseases. Your doctor may recommend an eating plan that includes:  Total fat: ______% or less of total calories a day.  Saturated fat: ______% or less of total calories a day.  Cholesterol: less than _________mg a day.  Fiber: ______g a day. What are tips for following this plan? Meal planning  At meals, divide your plate into four equal parts: ? Fill one-half of your plate with vegetables and green salads. ? Fill one-fourth of your plate with whole grains. ? Fill one-fourth of your plate with low-fat (lean) protein foods.  Eat fish that is high in omega-3 fats at least two times a week. This includes mackerel,  tuna, sardines, and salmon.  Eat foods that are high in fiber, such as whole grains, beans, apples, broccoli, carrots, peas, and barley. General tips   Work with your doctor to lose weight if you need to.  Avoid: ? Foods with added sugar. ? Fried foods. ? Foods with partially hydrogenated oils.  Limit alcohol intake to no more than 1 drink a day for nonpregnant women and 2 drinks a day for men. One drink equals 12 oz of beer, 5 oz of wine, or 1 oz of hard liquor. Reading food labels  Check food labels for: ? Trans fats. ? Partially hydrogenated oils. ? Saturated fat (g) in each serving. ? Cholesterol (mg) in each serving. ? Fiber (g) in each serving.  Choose foods with healthy fats, such as: ? Monounsaturated fats. ? Polyunsaturated fats. ? Omega-3 fats.  Choose grain products that have whole grains. Look for the word "whole" as the first word in the ingredient list. Cooking  Cook foods using low-fat methods. These include baking, boiling, grilling, and broiling.  Eat more home-cooked foods. Eat at restaurants and buffets less often.  Avoid cooking using saturated fats, such as butter, cream, palm oil, palm kernel oil, and coconut oil. Recommended foods  Fruits  All fresh, canned (in natural juice), or frozen fruits. Vegetables  Fresh or frozen vegetables (raw, steamed, roasted, or grilled). Green salads. Grains  Whole grains, such as whole wheat or whole grain breads, crackers, cereals, and pasta. Unsweetened oatmeal, bulgur, barley, quinoa, or brown rice. Corn or whole wheat flour tortillas. Meats and other  protein foods  Ground beef (85% or leaner), grass-fed beef, or beef trimmed of fat. Skinless chicken or [place]. Ground chicken or [place]. Pork trimmed of fat. All fish and seafood. Egg whites. Dried beans, peas, or lentils. Unsalted nuts or seeds. Unsalted canned beans. Nut butters without added sugar or oil. Dairy  Low-fat or nonfat dairy products, such as  skim or 1% milk, 2% or reduced-fat cheeses, low-fat and fat-free ricotta or cottage cheese, or plain low-fat and nonfat yogurt. Fats and oils  Tub margarine without trans fats. Light or reduced-fat mayonnaise and salad dressings. Avocado. Olive, canola, sesame, or safflower oils. The items listed above may not be a complete list of foods and beverages you can eat. Contact a dietitian for more information. Foods to avoid Fruits  Canned fruit in heavy syrup. Fruit in cream or butter sauce. Fried fruit. Vegetables  Vegetables cooked in cheese, cream, or butter sauce. Fried vegetables. Grains  White bread. White pasta. White rice. Cornbread. Bagels, pastries, and croissants. Crackers and snack foods that contain trans fat and hydrogenated oils. Meats and other protein foods  Fatty cuts of meat. Ribs, chicken wings, bacon, sausage, bologna, salami, chitterlings, fatback, hot dogs, bratwurst, and packaged lunch meats. Liver and organ meats. Whole eggs and egg yolks. Chicken and [place] with skin. Fried meat. Dairy  Whole or 2% milk, cream, half-and-half, and cream cheese. Whole milk cheeses. Whole-fat or sweetened yogurt. Full-fat cheeses. Nondairy creamers and whipped toppings. Processed cheese, cheese spreads, and cheese curds. Beverages  Alcohol. Sugar-sweetened drinks such as sodas, lemonade, and fruit drinks. Fats and oils  Butter, stick margarine, lard, shortening, ghee, or bacon fat. Coconut, palm kernel, and palm oils. Sweets and desserts  Corn syrup, sugars, honey, and molasses. Candy. Jam and jelly. Syrup. Sweetened cereals. Cookies, pies, cakes, donuts, muffins, and ice cream. The items listed above may not be a complete list of foods and beverages you should avoid. Contact a dietitian for more information. Summary  Choosing the right foods helps keep your fat and cholesterol at normal levels. This can keep you from getting certain diseases.  At meals, fill one-half of your  plate with vegetables and green salads.  Eat high-fiber foods, like whole grains, beans, apples, carrots, peas, and barley.  Limit added sugar, saturated fats, alcohol, and fried foods. This information is not intended to replace advice given to you by your health care provider. Make sure you discuss any questions you have with your health care provider. Document Released: 05/06/2012 Document Revised: 07/09/2018 Document Reviewed: 07/23/2017 Elsevier Patient Education  2020 [employer].

## 2019-05-21 NOTE — Progress Notes (Signed)
Virtual Visit via Telephone Note   This visit type was conducted due to national recommendations for restrictions regarding the COVID-19 Pandemic (e.g. social distancing) in an effort to limit this patient's exposure and mitigate transmission in our community.  Due to his co-morbid illnesses, this patient is at least at moderate risk for complications without adequate follow up.  This format is felt to be most appropriate for this patient at this time.  The patient did not have access to video technology/had technical difficulties with video requiring transitioning to audio format only (telephone).  All issues noted in this document were discussed and addressed.  No physical exam could be performed with this format.  Please refer to the patient's chart for his  consent to telehealth for [facility].  Evaluation Performed:  Follow-up visit  This visit type was conducted due to national recommendations for restrictions regarding the COVID-19 Pandemic (e.g. social distancing).  This format is felt to be most appropriate for this patient at this time.  All issues noted in this document were discussed and addressed.  No physical exam was performed (except for noted visual exam findings with Video Visits).  Please refer to the patient's chart (MyChart message for video visits and phone note for telephone visits) for the patient's consent to telehealth for [facility].  Date:  05/21/2019   ID:  Carlos [name], Carlos [name] 06/10/1941, MRN [id number]  Patient Location:  Home  Provider location:   Office  PCP:  Velna Hatchet, MD  Cardiologist:  Shelva Majestic, MD 05/06/2018   Electrophysiologist:  None   Chief Complaint:  Yearly follow up  History of Present Illness:    Carlos [name] is a 78 y.o. male who presents via audio/video conferencing for a telehealth visit today.    78 y.o. yo male who has a hx of CABG 2009 w/ LIMA-LAD, SVG-D2, SVG-OM1-OM2, SVG-PDA, MV 2013 ok, EF nl 2014 echo,  RBBB  He still feels very well. Much better than before the surgery. He walks 2-3 miles 3 x week, walks fast and does well.   Never gets chest pain. No sig DOE, no LE edema, no orthopnea or PND. Does not feel limited at all in his activity level by chest pain or SOB.  No palpitations. No presyncope or syncope.  LDL is not at goal, was 89 at a 10/2018 check. He is not interested in referral to [facility], but wants info on a low-cholesterol diet.  Has gained a little weight, but is fighting that.   Thinks he may have gotten the coronavirus in Feb. Felt worse than normal flu. Cough was different. Fevers. All sx resolved in Feb, has felt well since then. But it took him 3 weeks, and his wife 6 weeks to get over it. He has not been tested for antibodies.  The patient does not have symptoms concerning for COVID-19 infection (fever, chills, cough, or new shortness of breath).    Prior CV studies:   The following studies were reviewed today:  MYOVIEW: 2017  Nuclear stress EF: 55%.  No T wave inversion was noted during stress.  There was no ST segment deviation noted during stress.  This is a low risk study.  Defect 1: There is a small defect of mild severity.   Small, mild fixed apical and apical lateral perfusion defect, likely artifact. No reversible ischemia. LVEF 55% with normal wall motion. This is a low risk study.  ECHO: 09/07/2013 - Left  ventricle: The cavity size was normal. Wall thickness  was normal. Systolic function was normal. The estimated  ejection fraction was in the range of 60% to 65%. Wall  motion was normal; there were no regional wall motion  abnormalities. Left ventricular diastolic function  parameters were normal.  - Mitral valve: Calcified annulus. Valve area by pressure  half-time: 2.06cm^2.  - Atrial septum: No defect or patent foramen ovale was  identified.    Past Medical History:  Diagnosis Date  . Coronary artery disease 2009    LIMA-LAD, SVG-D1, SVG-OM1-0M2, SVG-PDA   Past Surgical History:  Procedure Laterality Date  . Carotid Duplex  05/04/2008   bilateral high bifurcations, no obvious plaque,   . COLONOSCOPY WITH PROPOFOL N/A 10/03/2015   Procedure: COLONOSCOPY WITH PROPOFOL;  Surgeon: Garlan Fair, MD;  Location: [facility];  Service: Endoscopy;  Laterality: N/A;  . CORONARY ARTERY BYPASS GRAFT  2009   LIMA-LAD, SVG-D1, SVG-OM1-0M2, SVG-PDA  . NM MYOCAR PERF EJECTION FRACTION  08/24/2009   Protocol:[facility], post stress EF 68%, exercise cap 13METS, low risk scan  . NM MYOCAR PERF WALL MOTION  08/21/2012   protocol:Bruce post stress EF64%, exercise cap 13METS. low risk scan  . TRANSTHORACIC ECHOCARDIOGRAM  08/24/2009   LV EF55%, normal study  . TRANSTHORACIC ECHOCARDIOGRAM  05/03/2008   LV 60% aortic valve thickness mild-moderate increase     Current Meds  Medication Sig  . aspirin 81 MG tablet Take 81 mg by mouth daily.  . Coenzyme Q10 (CO Q-10) 100 MG CAPS Take 1 tablet by mouth daily.  Marland Kitchen ezetimibe (ZETIA) 10 MG tablet TAKE 1 TABLET BY MOUTH EVERY DAY  . Multiple Vitamin (MULTIVITAMIN WITH MINERALS) TABS tablet Take 1 tablet by mouth daily.  . nebivolol (BYSTOLIC) 2.5 MG tablet Take 1 tablet (2.5 mg total) by mouth daily.  . rosuvastatin (CRESTOR) 40 MG tablet Take 1 tablet (40 mg total) by mouth daily.  . [DISCONTINUED] nebivolol (BYSTOLIC) 2.5 MG tablet Take 1 tablet (2.5 mg total) by mouth daily.  . [DISCONTINUED] rosuvastatin (CRESTOR) 40 MG tablet Take 1 tablet (40 mg total) by mouth daily.     Allergies:   Other   Social History   Tobacco Use  . Smoking status: Former Smoker    Packs/day: 1.00    Years: 20.00    Pack years: 20.00    Types: Cigarettes  . Smokeless tobacco: Never Used  . Tobacco comment: quit about 15 years ago  Substance Use Topics  . Alcohol use: Yes    Comment: 1 glass of wine daily  . Drug use: No     Family Hx: The patient's family history includes Heart  attack in his maternal grandfather.  ROS:   Please see the history of present illness.    All other systems reviewed and are negative.   Labs/Other Tests and Data Reviewed:    Recent Labs: 07/25/2018: ALT 22; BUN 20; Creatinine, Ser 1.15; Potassium 4.7; Sodium 141   Recent Lipid Panel Lab Results  Component Value Date/Time   CHOL 153  09/17/2018   TRIG 93     HDL 45     LDLCALC 89      Wt Readings from Last 3 Encounters:  05/21/19 185 lb (83.9 kg)  05/06/18 177 lb 12.8 oz (80.6 kg)  02/25/17 181 lb (82.1 kg)     Objective:    Vital Signs:  BP 126/62   Pulse (!) 56   Ht 5\' 8"  (1.727 m)  Wt 185 lb (83.9 kg)   BMI 28.13 kg/m    78 y.o. male in no acute distress over the phone   ASSESSMENT & PLAN:    1.  CAD:  - No ischemic sx  - 11 yr out from CABG but MV ok 2017 - He works on fitness and living a healthy life, is doing well. -Continue aspirin, beta-blocker, statin -Beta-blocker dosing is limited by baseline bradycardia.  However, he is asymptomatic with a heart rate in the 50s, continue low-dose Bystolic  2.  Hyperlipidemia, target LDL less than 70 -His weight is up a little bit, he thinks it may be from not exercising as much due to [place]. - His LDL was above target when last checked. - He is on high-dose Crestor and Zetia, compliant with both - He refused referral to the [facility], but requests information on low-cholesterol diet. - If his LDL is not at target when next checked, consider referral to the [facility] Clinic  3. HTN -Blood pressure is under good control on current meds, no changes   COVID-19 Education: The signs and symptoms of COVID-19 were discussed with the patient and how to seek care for testing (follow up with PCP or arrange E-visit).  The importance of social distancing was discussed today.  Patient Risk:   After full review of this patient's clinical status, I feel that they are at least moderate risk at this time.  Time:   Today, I  have spent 14 minutes with the patient with telehealth technology discussing cardiology and other health issues.     Medication Adjustments/Labs and Tests Ordered: Current medicines are reviewed at length with the patient today.  Concerns regarding medicines are outlined above.  Tests Ordered: No orders of the defined types were placed in this encounter.  Medication Changes: Meds ordered this encounter  Medications  . nebivolol (BYSTOLIC) 2.5 MG tablet    Sig: Take 1 tablet (2.5 mg total) by mouth daily.    Dispense:  90 tablet    Refill:  3  . rosuvastatin (CRESTOR) 40 MG tablet    Sig: Take 1 tablet (40 mg total) by mouth daily.    Dispense:  90 tablet    Refill:  3    Disposition:  Follow up with Dr Claiborne Billings in 6 months.  Jonetta Speak, PA-C  05/21/2019 10:54 AM    [facility]

## 2019-08-26 DIAGNOSIS — Z23 Encounter for immunization: Secondary | ICD-10-CM | POA: Diagnosis not present

## 2019-10-28 ENCOUNTER — Other Ambulatory Visit: Payer: Self-pay | Admitting: Cardiovascular Disease

## 2019-11-03 DIAGNOSIS — E7849 Other hyperlipidemia: Secondary | ICD-10-CM | POA: Diagnosis not present

## 2019-11-03 DIAGNOSIS — Z125 Encounter for screening for malignant neoplasm of prostate: Secondary | ICD-10-CM | POA: Diagnosis not present

## 2019-12-11 ENCOUNTER — Ambulatory Visit: Payer: Medicare Other | Attending: Internal Medicine

## 2019-12-11 DIAGNOSIS — Z23 Encounter for immunization: Secondary | ICD-10-CM

## 2019-12-11 NOTE — Progress Notes (Signed)
   Covid-19 Vaccination Clinic  Name:  Carlos [name]    MRN: [id number] DOB: [birth year]  12/11/2019  Carlos [name] was observed post Covid-19 immunization for 15 minutes without incidence. He was provided with Vaccine Information Sheet and instruction to access the V-Safe system.   Carlos [name] was instructed to call 911 with any severe reactions post vaccine: Marland Kitchen Difficulty breathing  . Swelling of your face and throat  . A fast heartbeat  . A bad rash all over your body  . Dizziness and weakness    Immunizations Administered    Name Date Dose VIS Date Route   Pfizer COVID-19 Vaccine 12/11/2019  8:49 AM 0.3 mL 10/30/2019 Intramuscular   Manufacturer: [place]   Lot: [id number]   [place]: [id number]

## 2020-01-01 ENCOUNTER — Ambulatory Visit: Payer: Medicare Other | Attending: Internal Medicine

## 2020-01-01 DIAGNOSIS — Z23 Encounter for immunization: Secondary | ICD-10-CM | POA: Insufficient documentation

## 2020-01-01 NOTE — Progress Notes (Signed)
   Covid-19 Vaccination Clinic  Name:  Carlos [name]    MRN: [id number] DOB: 01-Jul-1941  01/01/2020  Carlos [name] was observed post Covid-19 immunization for 15 minutes without incidence. He was provided with Vaccine Information Sheet and instruction to access the V-Safe system.   Carlos [name] was instructed to call 911 with any severe reactions post vaccine: Marland Kitchen Difficulty breathing  . Swelling of your face and throat  . A fast heartbeat  . A bad rash all over your body  . Dizziness and weakness    Immunizations Administered    Name Date Dose VIS Date Route   Pfizer COVID-19 Vaccine 01/01/2020  9:31 AM 0.3 mL 10/30/2019 Intramuscular   Manufacturer: [place]   Lot: [id number]   [place]: [id number]

## 2020-04-05 DIAGNOSIS — M1990 Unspecified osteoarthritis, unspecified site: Secondary | ICD-10-CM | POA: Diagnosis not present

## 2020-04-05 DIAGNOSIS — M25551 Pain in right hip: Secondary | ICD-10-CM | POA: Diagnosis not present

## 2020-05-02 DIAGNOSIS — M1990 Unspecified osteoarthritis, unspecified site: Secondary | ICD-10-CM | POA: Diagnosis not present

## 2020-05-02 DIAGNOSIS — M25551 Pain in right hip: Secondary | ICD-10-CM | POA: Diagnosis not present

## 2020-06-06 DIAGNOSIS — M1990 Unspecified osteoarthritis, unspecified site: Secondary | ICD-10-CM | POA: Diagnosis not present

## 2020-06-06 DIAGNOSIS — M25551 Pain in right hip: Secondary | ICD-10-CM | POA: Diagnosis not present

## 2020-06-08 ENCOUNTER — Other Ambulatory Visit: Payer: Self-pay | Admitting: Cardiovascular Disease

## 2020-06-13 DIAGNOSIS — M25551 Pain in right hip: Secondary | ICD-10-CM | POA: Diagnosis not present

## 2020-06-13 DIAGNOSIS — M1990 Unspecified osteoarthritis, unspecified site: Secondary | ICD-10-CM | POA: Diagnosis not present

## 2020-06-15 ENCOUNTER — Encounter: Payer: Self-pay | Admitting: Cardiovascular Disease

## 2020-06-15 ENCOUNTER — Other Ambulatory Visit: Payer: Self-pay

## 2020-06-15 ENCOUNTER — Ambulatory Visit (INDEPENDENT_AMBULATORY_CARE_PROVIDER_SITE_OTHER): Payer: Medicare Other | Admitting: Cardiovascular Disease

## 2020-06-15 VITALS — BP 116/80 | HR 63 | Ht 68.0 in | Wt 189.4 lb

## 2020-06-15 DIAGNOSIS — E785 Hyperlipidemia, unspecified: Secondary | ICD-10-CM | POA: Diagnosis not present

## 2020-06-15 DIAGNOSIS — Z951 Presence of aortocoronary bypass graft: Secondary | ICD-10-CM

## 2020-06-15 DIAGNOSIS — U071 COVID-19: Secondary | ICD-10-CM | POA: Diagnosis not present

## 2020-06-15 DIAGNOSIS — I10 Essential (primary) hypertension: Secondary | ICD-10-CM

## 2020-06-15 DIAGNOSIS — I2510 Atherosclerotic heart disease of native coronary artery without angina pectoris: Secondary | ICD-10-CM | POA: Diagnosis not present

## 2020-06-15 NOTE — Patient Instructions (Signed)
Medication Instructions:  CONTINUE WITH CURRENT MEDICATIONS. NO CHANGES.  *If you need a refill on your cardiac medications before your next appointment, please call your pharmacy*   Testing/Procedures: in the fall Your physician has requested that you have an echocardiogram. Echocardiography is a painless test that uses sound waves to create images of your heart. It provides your doctor with information about the size and shape of your heart and how well your heart's chambers and valves are working. This procedure takes approximately one hour. There are no restrictions for this procedure.     Follow-Up: At [facility], you and your health needs are our priority.  As part of our continuing mission to provide you with exceptional heart care, we have created designated Provider Care Teams.  These Care Teams include your primary Cardiologist (physician) and Advanced Practice Providers (APPs -  Physician Assistants and Nurse Practitioners) who all work together to provide you with the care you need, when you need it.  We recommend signing up for the patient portal called "MyChart".  Sign up information is provided on this After Visit Summary.  MyChart is used to connect with patients for Virtual Visits (Telemedicine).  Patients are able to view lab/test results, encounter notes, upcoming appointments, etc.  Non-urgent messages can be sent to your provider as well.   To learn more about what you can do with MyChart, go to [email or web address].    Your next appointment:   12 month(s)  The format for your next appointment:   In Person  Provider:   Shelva Majestic, MD

## 2020-06-15 NOTE — Progress Notes (Signed)
Patient ID: Carlos [name], male   DOB: 1941/07/03, 79 y.o.   MRN: [id number]      Primary M.D.: Dr. Loleta Books  HPI: [name] is a 79 year old gentleman was born in [place], [place], and worked for [employer].  He is a former patient of Dr. Rollene Fare.  [name] in June 2019. He presents for 2 year follow-up evaluation.   [name] has has CAD and in June 2009 underwent CABG surgery x5 by Dr. Gilford Raid after presenting with a non-ST segment elevation myocardial infarction. A LIMA was placed to the LAD, a  SVG to the second diagonal branch of the LAD, sequential vein graft to the first and second obtuse marginal branches of the circumflex coronary artery, and vein graft to the PDA branch of the RCA with endoscopic vein harvesting from both thighs.  He is not undergone cardiac catheterizations since that time.  His last nuclear perfusion study in October 2013 did not show any ischemia. In October 2014, he underwent carotid duplex imaging, which showed mild plaque without significant stenoses, bilaterally.  Laboratory in September 2014 showed a TC 132, triglycerides 72, LDL 74, HDL 44 on  Crestor 40 mg in addition to Niaspan 500 mg.  There was no evidence for anemia.  Glucose was minimally increased at 153, TSH was normal as was his PSA.  An echo Doppler study in October 2014 showed an ejection fraction of 60-65% with normal wall motion.  He retired from [employer] in November 2016 and continues to be active.  A nuclear stress test in 02/22/2016 was low risk and had mild attenuation artifact in the apical lateral segment.  There was no ischemia.  Ejection fraction was 55% with normal wall motion.    Laboratory from February 19, 2017 was normal.  Lipid studies were excellent with cholesterol 132, triglycerides 71, HDL 44, VLDL 14, and LDL 74.  When saw him in June 2019 he denied any episodes of chest pain.  He continued to be active and was walking 2 to 3 miles a day and exercises in a gym at least 3  days/week both doing cardiac and resistance training.  He exercises at the [facility].  Had undergone laboratory in December 2018 which showed a total cholesterol 155, HDL 43, LDL 94, triglycerides 89.  He was on  rosuvastatin 40 mg in addition to nebivolol 2.5 mg and aspirin 81 mg.  He denies any chest pain, palpitations, PND orthopnea, presyncope or syncope.  During that evaluation I added Zetia 10 mg with a goal to achieve an LDL less than 70.  Since [name], he was evaluated in a telemedicine visit with Rosaria Ferries in July 2020.  Since his last evaluation with me, he states that he believes he had 2 separate Covid infections.  He believed in February 2020 he developed Covid symptomatology which lasted for almost 2 to 4 weeks.  This year he had been vaccinated and again in June 2021 he experienced several days of recurrent symptomatology and his wife also experienced more prolonged symptoms following exposure from their grandchild.  He had undergone laboratory by Dr. Ardeth Perfect in December 2015 which showed an LDL cholesterol at 74.  Creatinine was 1.0.  BUN 12.  Presently, he has not been as active as he had been in the past secondary to issues with his hip as well as his right Achilles heel.  However he has undergone rehab and now is resuming increased activity.  He denies  any recurrent anginal symptoms.  He denies any recent fever chills or night sweats but even with his most recent infection in June he did experience fever and cough.  He is unaware of palpitations.  He denies any exertional dyspnea presyncope or syncope..  Past surgical history is notable for his CABG surgery, tonsillectomy, and tear duct surgery.   Current Outpatient Medications  Medication Sig Dispense Refill  . aspirin 81 MG tablet Take 81 mg by mouth daily.    Marland Kitchen BYSTOLIC 2.5 MG tablet TAKE 1 TABLET BY MOUTH EVERY DAY 90 tablet 3  . Coenzyme Q10 (CO Q-10) 100 MG CAPS Take 1 tablet by mouth daily.    Marland Kitchen ezetimibe (ZETIA)  10 MG tablet TAKE 1 TABLET BY MOUTH EVERY DAY 90 tablet 2  . glucosamine-chondroitin 500-400 MG tablet Take 1 tablet by mouth 3 (three) times daily.    . Multiple Vitamin (MULTIVITAMIN WITH MINERALS) TABS tablet Take 1 tablet by mouth daily.    . rosuvastatin (CRESTOR) 40 MG tablet TAKE 1 TABLET BY MOUTH EVERY DAY 90 tablet 3   No current facility-administered medications for this visit.    Socially, he is married. He has 2 children.  He worked for [employer] with [employer] worked a total of 23 years, retiring at age 8..  As a master's degree.  He did previously smoke cigarettes for 20 years, but quit in 1995.  He does drink wine.  He does exercise at minimum 3 days per week.  Family History  Problem Relation Age of Onset  . Heart attack Maternal Grandfather    ROS General: Negative; No fevers, chills, or night sweats;  HEENT: Negative; No changes in vision or hearing, sinus congestion, difficulty swallowing Pulmonary: Negative; No cough, wheezing, shortness of breath, hemoptysis Cardiovascular: See history of present illness GI: Negative; No nausea , vomiting, diarrhea, or abdominal pain GU: He has had some issues with decreased libido. Musculoskeletal: Right hip and Achilles discomfort Hematologic/Oncology: Negative; no easy bruising, bleeding Endocrine: Negative; no heat/cold intolerance; no diabetes Neuro: Negative; no changes in balance, headaches Skin: Negative; No rashes or skin lesions Psychiatric: Negative; No behavioral problems, depression Sleep: Negative; No snoring, daytime sleepiness, hypersomnolence, bruxism, restless legs, hypnogognic hallucinations, no cataplexy Other comprehensive 14 point system review is negative.  PE BP 116/80 (BP Location: Left Arm, Patient Position: Sitting, Cuff Size: Normal)   Pulse 63   Ht _0  (1.727 m)   Wt 189 lb 6.4 oz (85.9 kg)   BMI 28.80 kg/m    Repeat blood pressure by me was 128/76  Wt Readings from Last 3 Encounters:    06/15/20 189 lb 6.4 oz (85.9 kg)  05/21/19 185 lb (83.9 kg)  05/06/18 177 lb 12.8 oz (80.6 kg)   General: Alert, oriented, no distress.  Skin: normal turgor, no rashes, warm and dry HEENT: Normocephalic, atraumatic. Pupils equal round and reactive to light; sclera anicteric; extraocular muscles intact;  Nose without nasal septal hypertrophy Mouth/Parynx benign; Mallinpatti scale 3 Neck: No JVD, no carotid bruits; normal carotid upstroke Lungs: clear to ausculatation and percussion; no wheezing or rales Chest wall: without tenderness to palpitation Heart: PMI not displaced, RRR, s1 s2 normal, 1/6 systolic murmur, no diastolic murmur, no rubs, gallops, thrills, or heaves Abdomen: soft, nontender; no hepatosplenomehaly, BS+; abdominal aorta nontender and not dilated by palpation. Back: no CVA tenderness Pulses 2+ Musculoskeletal: full range of motion, normal strength, no joint deformities Extremities: no clubbing cyanosis or edema, Homan's sign negative  Neurologic: grossly  nonfocal; Cranial nerves grossly wnl Psychologic: Normal mood and affect  ECG (independently read by me): Sinus rhythm at 63 bpm.  Mild RV conduction delay.  Normal intervals.  June 2019 ECG (independently read by me): This bradycardia 50 bpm.  First-degree AV block with a PR interval of 218 ms.  RV conduction delay.  February 25, 2017 ECG (independently read by me): Sinus bradycardia 53 bpm.  Mild RV conduction delay.  Borderline first-degree AV block with a PR interval 206.  We seconds.  April 2017 ECG (independently read by me): Sinus bradycardia at 49 bpm with first-degree AV block.  PR interval 212 ms.  QTc interval 433 ms.  Incomplete right bundle branch block.  March 2015 ECG (independently read by me): Sinus bradycardia 53 beats per minute.  No ectopy.  No significant change from his prior ECG from September 2014  LABS: Blood work at [facility] on 10/12/2015. BUN/creatinine 15/1.0.   Hemoglobin/hematocrit 14.8/44.5 Total cholesterol 145, triglycerides 65, HDL 40, LDL 92.  I reviewed most recent lab work from November 03, 2019 which showed total cholesterol 135, HDL 45, LDL 74, triglycerides 81. BUN 12, creatinine 1.0 TSH 1.3 PSA 1.9  BMET  BMP Latest Ref Rng & Units 07/25/2018 02/19/2017 08/13/2013  Glucose 65 - 99 mg/dL 86 97 105(H)  BUN 8 - 27 mg/dL _0 Creatinine 0.76 - 1.27 mg/dL 1.15 1.07 1.02  BUN/Creat Ratio 10 - 24 17 - -  Sodium 134 - 144 mmol/L 141 141 143  Potassium 3.5 - 5.2 mmol/L 4.7 4.8 4.6  Chloride 96 - 106 mmol/L 104 108 109  CO2 20 - 29 mmol/L _1 Calcium 8.6 - 10.2 mg/dL 9.0 9.1 9.1    Hepatic Function Latest Ref Rng & Units 07/25/2018 02/19/2017 08/13/2013  Total Protein 6.0 - 8.5 g/dL 6.5 6.7 6.8  Albumin 3.5 - 4.8 g/dL 4.1 3.9 4.1  AST 0 - 40 IU/L _2 ALT 0 - 44 IU/L _3 Alk Phosphatase 39 - 117 IU/L 53 52 51  Total Bilirubin 0.0 - 1.2 mg/dL 0.7 0.8 0.7     CBC Latest Ref Rng & Units 02/19/2017 08/13/2013 05/08/2008  WBC 3.8 - 10.8 K/uL 5.6 5.6 7.9  Hemoglobin 13.2 - 17.1 g/dL 14.8 15.1 8.0(L)  Hematocrit 38 - 50 % 44.3 43.1 23.3(L)  Platelets 140 - 400 K/uL 169 156 110(L)    Lab Results  Component Value Date   MCV 95.9 02/19/2017   MCV 93.3 08/13/2013   MCV 93.8 05/08/2008   Lab Results  Component Value Date   TSH 2.14 02/19/2017    BNP    Component Value Date/Time   PROBNP <30.0 05/01/2008 0420     Lipid Panel     Component Value Date/Time   CHOL 122 07/25/2018 0822   TRIG 59 07/25/2018 0822   HDL 49 07/25/2018 0822   CHOLHDL 2.5 07/25/2018 0822   CHOLHDL 3.0 02/19/2017 0857   VLDL 14 02/19/2017 0857   LDLCALC 61 07/25/2018 0822     RADIOLOGY: No results found.  IMPRESSION:  1. Coronary artery disease involving native coronary artery of native heart without angina pectoris   2. Hx of CABG   3. Hyperlipidemia with target LDL less than 70   4. Essential hypertension   5. COVID-19 virus  infection: ? 2/20 and 6/21     ASSESSMENT AND PLAN: Mr. Josha is an 79 year old gentleman who suffered a NSTEMI  in June  2009 and underwent CABG surgery x5 by Dr. Cyndia Bent.  A nuclear perfusion study in October 2013 continued to show normal perfusion without scar or ischemia. A four-year follow-up study in April 2017 showed essentially normal perfusion, although minimal apical lateral defect was present.  This most likely is artifact.  There was no reversible ischemia.  Ejection fraction was 55% with normal wall motion.  Over the last several years, he has remained stable.  He apparently had 2 separate COVID-19 infections with his initial episode in late February 2020 which she had symptoms for at least 2 weeks.  He subsequently was vaccinated this year but had a recent infection in early June 2021 after exposure from his 27-year-old grandson.  Presently, he has been without anginal symptomatology.  His last echo Doppler study was in 2014.  With his 2 previous Covid infections I have recommended he undergo a follow-up echo Doppler study for reassessment of LV function since his last echo of 7 years ago and since he is 12 years post CABG revascularization.  His last nuclear perfusion study did not show any evidence for ischemia.  His blood pressure today is stable on his regimen consisting of Stoller 2.5 mg daily.  ECG shows stable rhythm with mild RV conduction delay.  He is now on rosuvastatin 40 mg and Zetia 10 mg with target LDL less than 70.  LDL in December 2020 was 74.  He has had some orthopedic issues with his hip and his right Achilles for which she has been evaluated and has undergone rehabilitation with improvement.  His weight is stable.  He will sinew current therapy.  We will schedule his echo Doppler study to be done sometime at the end of the summer or early fall.  He will follow up with Dr. Lavone Neri or the for laboratory.  I will see him in 1 year for cardiology reevaluation.    Mr. Holzman  continues to be asymptomatic with reference to recurrent anginal symptomatology.  He is exercising regularly and remaining active.  His blood pressure today is stable on low-dose nebivolol 2.5 mg daily.  Resting pulse is bradycardic and he is asymptomatic.  I reviewed his most recent laboratory from December 2018.  LDL cholesterol was 94.  In this patient with established CAD and status post CABG surgery, I have recommended more aggressive treatment.  I will add Zetia 10 mg to his current dose of rosuvastatin 40 mg for more optimal lowering with target LDL less than 70.  I also had a lengthy discussion with him today regarding the Fourier trial with Repatha if he is unable to reach target.  He will undergo repeat chemistry and lipid studies in 2 to 3 months.  He will be seeing Dr. however the in the future.  I will review his laboratory when available.  I will see him in 1 year for reevaluation or sooner if problems arise.   Time spent: 25 minutes Troy Sine, MD, [facility] 06/15/2020 8:32 AM

## 2020-06-20 DIAGNOSIS — M25551 Pain in right hip: Secondary | ICD-10-CM | POA: Diagnosis not present

## 2020-06-20 DIAGNOSIS — M1990 Unspecified osteoarthritis, unspecified site: Secondary | ICD-10-CM | POA: Diagnosis not present

## 2020-07-20 ENCOUNTER — Ambulatory Visit (HOSPITAL_COMMUNITY): Payer: Medicare Other | Attending: Cardiovascular Disease

## 2020-07-20 ENCOUNTER — Other Ambulatory Visit: Payer: Self-pay

## 2020-07-20 DIAGNOSIS — E785 Hyperlipidemia, unspecified: Secondary | ICD-10-CM | POA: Diagnosis not present

## 2020-07-20 DIAGNOSIS — I2510 Atherosclerotic heart disease of native coronary artery without angina pectoris: Secondary | ICD-10-CM

## 2020-07-20 DIAGNOSIS — I10 Essential (primary) hypertension: Secondary | ICD-10-CM | POA: Diagnosis not present

## 2020-07-20 LAB — ECHOCARDIOGRAM COMPLETE
Area-P 1/2: 2.6 cm2
S' Lateral: 3.2 cm

## 2020-07-24 DIAGNOSIS — Z23 Encounter for immunization: Secondary | ICD-10-CM | POA: Diagnosis not present

## 2020-07-25 ENCOUNTER — Other Ambulatory Visit: Payer: Self-pay | Admitting: Cardiovascular Disease

## 2020-08-16 DIAGNOSIS — Z23 Encounter for immunization: Secondary | ICD-10-CM | POA: Diagnosis not present

## 2020-11-24 DIAGNOSIS — R059 Cough, unspecified: Secondary | ICD-10-CM | POA: Diagnosis not present

## 2020-11-24 DIAGNOSIS — J019 Acute sinusitis, unspecified: Secondary | ICD-10-CM | POA: Diagnosis not present

## 2020-11-24 DIAGNOSIS — Z1152 Encounter for screening for COVID-19: Secondary | ICD-10-CM | POA: Diagnosis not present

## 2020-12-13 DIAGNOSIS — Z125 Encounter for screening for malignant neoplasm of prostate: Secondary | ICD-10-CM | POA: Diagnosis not present

## 2020-12-13 DIAGNOSIS — E785 Hyperlipidemia, unspecified: Secondary | ICD-10-CM | POA: Diagnosis not present

## 2020-12-16 DIAGNOSIS — Z0000 Encounter for general adult medical examination without abnormal findings: Secondary | ICD-10-CM | POA: Diagnosis not present

## 2020-12-16 DIAGNOSIS — E785 Hyperlipidemia, unspecified: Secondary | ICD-10-CM | POA: Diagnosis not present

## 2021-01-17 DIAGNOSIS — R82998 Other abnormal findings in urine: Secondary | ICD-10-CM | POA: Diagnosis not present

## 2021-01-17 DIAGNOSIS — E785 Hyperlipidemia, unspecified: Secondary | ICD-10-CM | POA: Diagnosis not present

## 2021-01-17 DIAGNOSIS — Z1339 Encounter for screening examination for other mental health and behavioral disorders: Secondary | ICD-10-CM | POA: Diagnosis not present

## 2021-01-17 DIAGNOSIS — Z1331 Encounter for screening for depression: Secondary | ICD-10-CM | POA: Diagnosis not present

## 2021-01-17 DIAGNOSIS — Z0000 Encounter for general adult medical examination without abnormal findings: Secondary | ICD-10-CM | POA: Diagnosis not present

## 2021-01-17 DIAGNOSIS — I2510 Atherosclerotic heart disease of native coronary artery without angina pectoris: Secondary | ICD-10-CM | POA: Diagnosis not present

## 2021-03-17 ENCOUNTER — Emergency Department (HOSPITAL_COMMUNITY)
Admission: EM | Admit: 2021-03-17 | Discharge: 2021-03-18 | Discharge status: Against Medical Advice | Payer: Medicare Other | Attending: Emergency Medicine | Admitting: Emergency Medicine

## 2021-03-17 ENCOUNTER — Emergency Department (HOSPITAL_COMMUNITY): Payer: Medicare Other

## 2021-03-17 DIAGNOSIS — W01198A Fall on same level from slipping, tripping and stumbling with subsequent striking against other object, initial encounter: Secondary | ICD-10-CM | POA: Insufficient documentation

## 2021-03-17 DIAGNOSIS — M7989 Other specified soft tissue disorders: Secondary | ICD-10-CM | POA: Diagnosis not present

## 2021-03-17 DIAGNOSIS — S299XXA Unspecified injury of thorax, initial encounter: Secondary | ICD-10-CM | POA: Diagnosis not present

## 2021-03-17 DIAGNOSIS — Z7982 Long term (current) use of aspirin: Secondary | ICD-10-CM | POA: Insufficient documentation

## 2021-03-17 DIAGNOSIS — M47812 Spondylosis without myelopathy or radiculopathy, cervical region: Secondary | ICD-10-CM | POA: Diagnosis not present

## 2021-03-17 DIAGNOSIS — S0990XA Unspecified injury of head, initial encounter: Secondary | ICD-10-CM | POA: Diagnosis not present

## 2021-03-17 DIAGNOSIS — I2510 Atherosclerotic heart disease of native coronary artery without angina pectoris: Secondary | ICD-10-CM | POA: Insufficient documentation

## 2021-03-17 DIAGNOSIS — Z951 Presence of aortocoronary bypass graft: Secondary | ICD-10-CM | POA: Insufficient documentation

## 2021-03-17 DIAGNOSIS — R55 Syncope and collapse: Secondary | ICD-10-CM | POA: Insufficient documentation

## 2021-03-17 DIAGNOSIS — Z20822 Contact with and (suspected) exposure to covid-19: Secondary | ICD-10-CM | POA: Insufficient documentation

## 2021-03-17 DIAGNOSIS — Z87891 Personal history of nicotine dependence: Secondary | ICD-10-CM | POA: Diagnosis not present

## 2021-03-17 DIAGNOSIS — M2578 Osteophyte, vertebrae: Secondary | ICD-10-CM | POA: Diagnosis not present

## 2021-03-17 DIAGNOSIS — W19XXXA Unspecified fall, initial encounter: Secondary | ICD-10-CM | POA: Diagnosis not present

## 2021-03-17 DIAGNOSIS — S3991XA Unspecified injury of abdomen, initial encounter: Secondary | ICD-10-CM | POA: Diagnosis not present

## 2021-03-17 DIAGNOSIS — Y92828 Other wilderness area as the place of occurrence of the external cause: Secondary | ICD-10-CM | POA: Diagnosis not present

## 2021-03-17 DIAGNOSIS — Z043 Encounter for examination and observation following other accident: Secondary | ICD-10-CM | POA: Diagnosis not present

## 2021-03-17 DIAGNOSIS — Y9301 Activity, walking, marching and hiking: Secondary | ICD-10-CM | POA: Insufficient documentation

## 2021-03-17 DIAGNOSIS — S59901A Unspecified injury of right elbow, initial encounter: Secondary | ICD-10-CM | POA: Diagnosis not present

## 2021-03-17 DIAGNOSIS — S59911A Unspecified injury of right forearm, initial encounter: Secondary | ICD-10-CM | POA: Diagnosis not present

## 2021-03-17 DIAGNOSIS — R402412 Glasgow coma scale score 13-15, at arrival to emergency department: Secondary | ICD-10-CM | POA: Insufficient documentation

## 2021-03-17 DIAGNOSIS — R220 Localized swelling, mass and lump, head: Secondary | ICD-10-CM | POA: Diagnosis not present

## 2021-03-17 LAB — COMPREHENSIVE METABOLIC PANEL
ALT: 26 U/L (ref 0–44)
AST: 32 U/L (ref 15–41)
Albumin: 3.9 g/dL (ref 3.5–5.0)
Alkaline Phosphatase: 48 U/L (ref 38–126)
Anion gap: 8 (ref 5–15)
BUN: 17 mg/dL (ref 8–23)
CO2: 22 mmol/L (ref 22–32)
Calcium: 9.3 mg/dL (ref 8.9–10.3)
Chloride: 108 mmol/L (ref 98–111)
Creatinine, Ser: 1.09 mg/dL (ref 0.61–1.24)
GFR, Estimated: >60 mL/min (ref >60)
Glucose, Bld: 120 mg/dL — ABNORMAL HIGH (ref 70–99)
Potassium: 4.1 mmol/L (ref 3.5–5.1)
Sodium: 138 mmol/L (ref 135–145)
Total Bilirubin: 0.7 mg/dL (ref 0.3–1.2)
Total Protein: 7 g/dL (ref 6.5–8.1)

## 2021-03-17 LAB — I-STAT CHEM 8, ED
BUN: 19 mg/dL (ref 8–23)
Calcium, Ion: 1.2 mmol/L (ref 1.15–1.40)
Chloride: 108 mmol/L (ref 98–111)
Creatinine, Ser: 1.1 mg/dL (ref 0.61–1.24)
Glucose, Bld: 120 mg/dL — ABNORMAL HIGH (ref 70–99)
HCT: 44 % (ref 39.0–52.0)
Hemoglobin: 15 g/dL (ref 13.0–17.0)
Potassium: 4.1 mmol/L (ref 3.5–5.1)
Sodium: 142 mmol/L (ref 135–145)
TCO2: 24 mmol/L (ref 22–32)

## 2021-03-17 LAB — PROTIME-INR
INR: 1 (ref 0.8–1.2)
Prothrombin Time: 12.8 seconds (ref 11.4–15.2)

## 2021-03-17 LAB — CBC
HCT: 44.6 % (ref 39.0–52.0)
Hemoglobin: 14.8 g/dL (ref 13.0–17.0)
MCH: 31.6 pg (ref 26.0–34.0)
MCHC: 33.2 g/dL (ref 30.0–36.0)
MCV: 95.1 fL (ref 80.0–100.0)
Platelets: 179 10*3/uL (ref 150–400)
RBC: 4.69 MIL/uL (ref 4.22–5.81)
RDW: 12.9 % (ref 11.5–15.5)
WBC: 13.5 10*3/uL — ABNORMAL HIGH (ref 4.0–10.5)
nRBC: 0 % (ref 0.0–0.2)

## 2021-03-17 LAB — ETHANOL: Alcohol, Ethyl (B): <10 mg/dL (ref <10)

## 2021-03-17 LAB — LACTIC ACID, PLASMA: Lactic Acid, Venous: 1.4 mmol/L (ref 0.5–1.9)

## 2021-03-17 LAB — SAMPLE TO BLOOD BANK

## 2021-03-17 LAB — RESP PANEL BY RT-PCR (FLU A&B, COVID) ARPGX2
Influenza A by PCR: NEGATIVE
Influenza B by PCR: NEGATIVE
SARS Coronavirus 2 by RT PCR: NEGATIVE

## 2021-03-17 MED ORDER — IOHEXOL 300 MG/ML  SOLN
100.0000 mL | Freq: Once | INTRAMUSCULAR | Status: AC | PRN
  Administered 2021-03-17: 100 mL via INTRAVENOUS

## 2021-03-17 MED ORDER — SODIUM CHLORIDE 0.9 % IV BOLUS
1000.0000 mL | Freq: Once | INTRAVENOUS | Status: AC
  Administered 2021-03-17: 1000 mL via INTRAVENOUS

## 2021-03-17 NOTE — ED Notes (Signed)
Pt back from CT

## 2021-03-17 NOTE — ED Notes (Signed)
Pt back from X-Ray.  

## 2021-03-17 NOTE — ED Triage Notes (Signed)
Pt here pov after fall around 4pm.Ovious deformity to right arm. No LOC. Pt on baby Asprin. Pt had witnessed seizure in lobby. No seizure history. Airway clear on arrival to room. Pt noted to be hypotensive in lobby with pressure of 66/42. Pt A&Ox4 on arrival into room.

## 2021-03-17 NOTE — ED Notes (Signed)
Patient transported to X-ray 

## 2021-03-17 NOTE — ED Notes (Signed)
Patient transported to CT 

## 2021-03-17 NOTE — ED Provider Notes (Signed)
[facility] EMERGENCY DEPARTMENT Provider Note   CSN: [id number] Arrival date & time: 03/17/21  2119     History Chief Complaint  Patient presents with  . Loss of Consciousness    Pt has had a couple of episodes of syncope today. Fell and injured head and right arm.    Carlos [name] is a 80 y.o. male.  HPI Patient has a history of CAD and presents after a syncopal episode in the lobby.  Patient did have a ground-level fall earlier today approximately 8 hours prior to arrival.  When being brought back from the lobby, patient now ANO x4 GCS 15.  Denies fevers or chills, nausea or vomiting, syncope or shortness of breath at this time.  He did fall mechanically while walking down a hill earlier today and fell and hit his forehead and right arm where he has obvious deformities at both sites.  Patient has no neurovascular compromise and is in no distress at this time.  Denies blood thinner use or any other injuries at this time.  Past Medical History:  Diagnosis Date  . Coronary artery disease 2009   LIMA-LAD, SVG-D1, SVG-OM1-0M2, SVG-PDA    Patient Active Problem List   Diagnosis Date Noted  . Sinus bradycardia on ECG 02/19/2015  . Hyperlipidemia with target LDL less than 70 03/05/2014  . CAD (coronary artery disease) 03/05/2014  . Decreased libido 03/05/2014  . S/P tonsillectomy 1945 03/05/2014    Past Surgical History:  Procedure Laterality Date  . Carotid Duplex  05/04/2008   bilateral high bifurcations, no obvious plaque,   . COLONOSCOPY WITH PROPOFOL N/A 10/03/2015   Procedure: COLONOSCOPY WITH PROPOFOL;  Surgeon: Garlan Fair, MD;  Location: [facility] ENDOSCOPY;  Service: Endoscopy;  Laterality: N/A;  . CORONARY ARTERY BYPASS GRAFT  2009   LIMA-LAD, SVG-D1, SVG-OM1-0M2, SVG-PDA  . NM MYOCAR PERF EJECTION FRACTION  08/24/2009   Protocol:Bruce, post stress EF 68%, exercise cap 13METS, low risk scan  . NM MYOCAR PERF WALL MOTION  08/21/2012    protocol:Bruce post stress EF64%, exercise cap 13METS. low risk scan  . TRANSTHORACIC ECHOCARDIOGRAM  08/24/2009   LV EF55%, normal study  . TRANSTHORACIC ECHOCARDIOGRAM  05/03/2008   LV 60% aortic valve thickness mild-moderate increase       Family History  Problem Relation Age of Onset  . Heart attack Maternal Grandfather     Social History   Tobacco Use  . Smoking status: Former Smoker    Packs/day: 1.00    Years: 20.00    Pack years: 20.00    Types: Cigarettes  . Smokeless tobacco: Never Used  . Tobacco comment: quit about 15 years ago  Substance Use Topics  . Alcohol use: Yes    Comment: 1 glass of wine daily  . Drug use: No    Home Medications Prior to Admission medications   Medication Sig Start Date End Date Taking? Authorizing Provider  aspirin 81 MG tablet Take 81 mg by mouth daily.   Yes [provider]  BYSTOLIC 2.5 MG tablet TAKE 1 TABLET BY MOUTH EVERY DAY Patient taking differently: Take 2.5 mg by mouth daily. 06/08/20  Yes Troy Sine, MD  Coenzyme Q10 (CO Q-10) 100 MG CAPS Take 100 mg by mouth daily.   Yes [provider]  ezetimibe (ZETIA) 10 MG tablet TAKE 1 TABLET BY MOUTH EVERY DAY Patient taking differently: Take 10 mg by mouth daily. 07/26/20  Yes Troy Sine, MD  glucosamine-chondroitin 500-400  MG tablet Take 2 tablets by mouth daily.   Yes [provider]  Multiple Vitamin (MULTIVITAMIN WITH MINERALS) TABS tablet Take 1 tablet by mouth daily.   Yes [provider]  rosuvastatin (CRESTOR) 40 MG tablet TAKE 1 TABLET BY MOUTH EVERY DAY Patient taking differently: Take 40 mg by mouth daily. 06/08/20  Yes Troy Sine, MD    Allergies    Other  Review of Systems   Review of Systems  Constitutional: Negative for chills and fever.  HENT: Negative for ear pain and sore throat.   Eyes: Negative for pain and visual disturbance.  Respiratory: Negative for cough and shortness of breath.   Cardiovascular:  Negative for chest pain and palpitations.  Gastrointestinal: Negative for abdominal pain and vomiting.  Genitourinary: Negative for dysuria and hematuria.  Musculoskeletal: Negative for arthralgias and back pain.  Skin: Negative for color change and rash.  Neurological: Positive for syncope. Negative for seizures.  All other systems reviewed and are negative.   Physical Exam Updated Vital Signs BP 100/88   Pulse (!) 58   Temp 97.8 F (36.6 C) (Oral)   Resp 20   Ht 5\' 8"  (1.727 m)   Wt 82.6 kg   SpO2 100%   BMI 27.67 kg/m   Physical Exam Vitals and nursing note reviewed.  Constitutional:      Appearance: He is well-developed.  HENT:     Head: Normocephalic and atraumatic.     Nose: No congestion or rhinorrhea.     Mouth/Throat:     Mouth: Mucous membranes are moist.     Pharynx: Oropharynx is clear. No oropharyngeal exudate.  Eyes:     Conjunctiva/sclera: Conjunctivae normal.     Pupils: Pupils are equal, round, and reactive to light.  Cardiovascular:     Rate and Rhythm: Normal rate and regular rhythm.     Heart sounds: No murmur heard.   Pulmonary:     Effort: Pulmonary effort is normal. No respiratory distress.     Breath sounds: Normal breath sounds.  Abdominal:     Palpations: Abdomen is soft.     Tenderness: There is no abdominal tenderness.  Musculoskeletal:        General: Deformity and signs of injury present. No swelling or tenderness. Normal range of motion.     Cervical back: Neck supple. No rigidity or tenderness.  Skin:    General: Skin is warm and dry.  Neurological:     General: No focal deficit present.     Mental Status: He is alert and oriented to person, place, and time. Mental status is at baseline.     Cranial Nerves: No cranial nerve deficit.     Motor: No weakness.     ED Results / Procedures / Treatments   Labs (all labs ordered are listed, but only abnormal results are displayed) Labs Reviewed  COMPREHENSIVE METABOLIC PANEL -  Abnormal; Notable for the following components:      Result Value   Glucose, Bld 120 (*)    All other components within normal limits  CBC - Abnormal; Notable for the following components:   WBC 13.5 (*)    All other components within normal limits  I-STAT CHEM 8, ED - Abnormal; Notable for the following components:   Glucose, Bld 120 (*)    All other components within normal limits  RESP PANEL BY RT-PCR (FLU A&B, COVID) ARPGX2  ETHANOL  LACTIC ACID, PLASMA  PROTIME-INR  URINALYSIS, ROUTINE W REFLEX MICROSCOPIC  SAMPLE TO BLOOD BANK    EKG EKG Interpretation  Date/Time:  Friday March 17 2021 21:39:22 EDT Ventricular Rate:  51 PR Interval:  206 QRS Duration: 97 QT Interval:  469 QTC Calculation: 432 R Axis:   59 Text Interpretation: Sinus rhythm Consider right atrial enlargement Consider right ventricular hypertrophy Minimal ST elevation, inferior leads No significant change since last tracing Confirmed by Isla Pence [phone]) on 03/17/2021 10:31:50 PM   Radiology DG Elbow Complete Right  Result Date: 03/17/2021 CLINICAL DATA:  Status post trauma. EXAM: RIGHT ELBOW - COMPLETE 3+ VIEW COMPARISON:  None. FINDINGS: There is no evidence of an acute fracture, dislocation, or joint effusion. A small chronic appearing cortical density is seen adjacent to the medial epicondyle of the distal right humerus. Moderate severity soft tissue swelling is seen along the lateral aspect of the right elbow and proximal right forearm. IMPRESSION: Soft tissue swelling, as described above, without an acute osseous abnormality. Electronically Signed   By: Virgina Norfolk M.D.   On: 03/17/2021 23:02   DG Forearm Right  Result Date: 03/17/2021 CLINICAL DATA:  Status post trauma. EXAM: RIGHT FOREARM - 2 VIEW COMPARISON:  None. FINDINGS: There is no evidence of an acute fracture or dislocation. A small, chronic appearing cortical density is seen adjacent to the lateral epicondyle of the distal right  humerus. Mild to moderate severity soft tissue swelling is seen along the volar aspect of the proximal to mid right forearm. IMPRESSION: Volar soft tissue swelling, without an acute osseous abnormality. Electronically Signed   By: Virgina Norfolk M.D.   On: 03/17/2021 23:01   CT HEAD WO CONTRAST  Result Date: 03/18/2021 CLINICAL DATA:  Status post fall. EXAM: CT HEAD WITHOUT CONTRAST TECHNIQUE: Contiguous axial images were obtained from the base of the skull through the vertex without intravenous contrast. COMPARISON:  None. FINDINGS: Brain: There is mild cerebral atrophy with widening of the extra-axial spaces and ventricular dilatation. There are areas of decreased attenuation within the white matter tracts of the supratentorial brain, consistent with microvascular disease changes. Vascular: No hyperdense vessel or unexpected calcification. Skull: Normal. Negative for fracture or focal lesion. Sinuses/Orbits: No acute finding. Other: Mild to moderate severity right supraorbital and right frontal scalp soft tissue swelling is seen. IMPRESSION: 1. Generalized cerebral atrophy. 2. Right supraorbital and right frontal scalp soft tissue swelling without acute osseous abnormality or acute intracranial abnormality. Electronically Signed   By: Virgina Norfolk M.D.   On: 03/18/2021 00:01   CT CHEST W CONTRAST  Result Date: 03/18/2021 CLINICAL DATA:  Status post trauma. EXAM: CT CHEST, ABDOMEN, AND PELVIS WITH CONTRAST TECHNIQUE: Multidetector CT imaging of the chest, abdomen and pelvis was performed following the standard protocol during bolus administration of intravenous contrast. CONTRAST:  111mL OMNIPAQUE IOHEXOL 300 MG/ML  SOLN COMPARISON:  None. FINDINGS: CT CHEST FINDINGS Cardiovascular: There is mild calcification of the aortic arch. Normal heart size with marked severity coronary artery calcification. Coronary artery surgical clips are also noted. No pericardial effusion. Mediastinum/Nodes: No enlarged  mediastinal, hilar, or axillary lymph nodes. Thyroid gland, trachea, and esophagus demonstrate no significant findings. Lungs/Pleura: Mild areas of linear scarring and/or atelectasis are seen within the bilateral lower lobes and inferior aspect of the left upper lobe. There is no evidence of a pleural effusion or pneumothorax. Musculoskeletal: Multiple sternal wires are noted. Degenerative changes are seen throughout the thoracic spine. CT ABDOMEN PELVIS FINDINGS Hepatobiliary: No focal liver abnormality is seen. No gallstones, gallbladder wall thickening, or biliary dilatation.  Pancreas: Unremarkable. No pancreatic ductal dilatation or surrounding inflammatory changes. Spleen: Normal in size without focal abnormality. Adrenals/Urinary Tract: Adrenal glands are unremarkable. Kidneys are normal, without renal calculi, focal lesion, or hydronephrosis. Bladder is unremarkable. Stomach/Bowel: Stomach is within normal limits. Appendix appears normal. No evidence of bowel wall thickening, distention, or inflammatory changes. Noninflamed diverticula are seen throughout the sigmoid colon. Vascular/Lymphatic: Aortic atherosclerosis. No enlarged abdominal or pelvic lymph nodes. Reproductive: The prostate gland is markedly enlarged. Other: No abdominal wall hernia or abnormality. No abdominopelvic ascites. Musculoskeletal: Moderate to marked severity multilevel degenerative changes are seen throughout the lumbar spine. IMPRESSION: 1. Mild left upper lobe and bilateral lower lobe linear scarring and/or atelectasis. 2. Evidence of prior median sternotomy/CABG. 3. Sigmoid diverticulosis. 4. Markedly enlarged prostate gland. 5. Aortic atherosclerosis. Electronically Signed   By: Virgina Norfolk M.D.   On: 03/18/2021 00:11   CT CERVICAL SPINE WO CONTRAST  Result Date: 03/18/2021 CLINICAL DATA:  Status post fall. EXAM: CT CERVICAL SPINE WITHOUT CONTRAST TECHNIQUE: Multidetector CT imaging of the cervical spine was performed  without intravenous contrast. Multiplanar CT image reconstructions were also generated. COMPARISON:  None. FINDINGS: Alignment: Normal. Skull base and vertebrae: No acute fracture. No primary bone lesion or focal pathologic process. Soft tissues and spinal canal: No prevertebral fluid or swelling. No visible canal hematoma. Disc levels: Moderate to marked severity endplate sclerosis is seen at the level of C5-C6. Mild to moderate severity osteophyte formation is noted at the levels of C4-C5, C5-C6, C6-C7 and C7-T1. There is marked severity narrowing of the anterior atlantoaxial articulation. Moderate to marked severity intervertebral disc space narrowing is seen at the level of C5-C6. Mild intervertebral disc space narrowing is noted throughout the remainder of the cervical spine. Bilateral marked severity multilevel facet joint hypertrophy is noted. Upper chest: Negative. Other: None. IMPRESSION: 1. No evidence of acute cervical spine fracture or subluxation. 2. Moderate to marked severity multilevel degenerative changes, most prominent at the level of C5-C6. Electronically Signed   By: Virgina Norfolk M.D.   On: 03/18/2021 00:15   CT ABDOMEN PELVIS W CONTRAST  Result Date: 03/18/2021 CLINICAL DATA:  Status post trauma. EXAM: CT CHEST, ABDOMEN, AND PELVIS WITH CONTRAST TECHNIQUE: Multidetector CT imaging of the chest, abdomen and pelvis was performed following the standard protocol during bolus administration of intravenous contrast. CONTRAST:  131mL OMNIPAQUE IOHEXOL 300 MG/ML  SOLN COMPARISON:  None. FINDINGS: CT CHEST FINDINGS Cardiovascular: There is mild calcification of the aortic arch. Normal heart size with marked severity coronary artery calcification. Coronary artery surgical clips are also noted. No pericardial effusion. Mediastinum/Nodes: No enlarged mediastinal, hilar, or axillary lymph nodes. Thyroid gland, trachea, and esophagus demonstrate no significant findings. Lungs/Pleura: Mild areas of  linear scarring and/or atelectasis are seen within the bilateral lower lobes and inferior aspect of the left upper lobe. There is no evidence of a pleural effusion or pneumothorax. Musculoskeletal: Multiple sternal wires are noted. Degenerative changes are seen throughout the thoracic spine. CT ABDOMEN PELVIS FINDINGS Hepatobiliary: No focal liver abnormality is seen. No gallstones, gallbladder wall thickening, or biliary dilatation. Pancreas: Unremarkable. No pancreatic ductal dilatation or surrounding inflammatory changes. Spleen: Normal in size without focal abnormality. Adrenals/Urinary Tract: Adrenal glands are unremarkable. Kidneys are normal, without renal calculi, focal lesion, or hydronephrosis. Bladder is unremarkable. Stomach/Bowel: Stomach is within normal limits. Appendix appears normal. No evidence of bowel wall thickening, distention, or inflammatory changes. Noninflamed diverticula are seen throughout the sigmoid colon. Vascular/Lymphatic: Aortic atherosclerosis. No enlarged abdominal or pelvic  lymph nodes. Reproductive: The prostate gland is markedly enlarged. Other: No abdominal wall hernia or abnormality. No abdominopelvic ascites. Musculoskeletal: Moderate to marked severity multilevel degenerative changes are seen throughout the lumbar spine. IMPRESSION: 1. Mild left upper lobe and bilateral lower lobe linear scarring and/or atelectasis. 2. Evidence of prior median sternotomy/CABG. 3. Sigmoid diverticulosis. 4. Markedly enlarged prostate gland. 5. Aortic atherosclerosis. Electronically Signed   By: Virgina Norfolk M.D.   On: 03/18/2021 00:16    Procedures Procedures   Medications Ordered in ED Medications  sodium chloride 0.9 % bolus 1,000 mL (0 mLs Intravenous Stopped 03/18/21 0011)  iohexol (OMNIPAQUE) 300 MG/ML solution 100 mL (100 mLs Intravenous Contrast Given 03/17/21 2323)    ED Course  I have reviewed the triage vital signs and the nursing notes.  Pertinent labs & imaging  results that were available during my care of the patient were reviewed by me and considered in my medical decision making (see chart for details).    MDM Rules/Calculators/A&P                          Patient is an 80 year old male with multiple episodes of syncope presenting after another episode today.  Patient did fall walking down a hill earlier today which he attributes to a mechanical fall.  He fell and hit his right forearm and right head.  When he came in today to be evaluated for pain in his right forearm about 4 hours after the fall, he syncopized while at the front desk.  He did not hit anything else on his way down and fell backwards.  He denies any amnesia to the event and states that he was awake during and just got very faint.  Notably during the event his blood pressure and heart rate did drop.  Given the nature of his injuries, patient treated as a nonlevel trauma.  Traumatic scans were performed including CTA chest abdomen pelvis, CT head, CT C-spine which resulted with no acute abnormality.  Additionally screening labs were performed with no acute abnormalities at this time. Patient's history of present illness and physical exam findings given the 2 falls in the past 24 hours and syncopal episode are most consistent with cardiogenic syncope given his bradycardia.  However, patient is wanting to be discharged and does not want to stay in the hospital any longer than absolutely necessary.  CT scans overall reassuring at this time if patient is demonstrating capacity and understanding of the situation.  Patient does plan to follow-up with his cardiologist regarding this episode.  However given the severity of his initial presentation, patient is formally leaving [place] at this time.  Patient was treated in the best of her ability and recommended to follow-up with his cardiologist in the outpatient setting. Final Clinical Impression(s) / ED Diagnoses Final diagnoses:   Syncope and collapse    Rx / DC Orders ED Discharge Orders    None       Tretha Sciara, MD 03/18/21 Ty Hilts, MD 03/20/21 1529

## 2021-03-17 NOTE — ED Provider Notes (Incomplete)
[facility] EMERGENCY DEPARTMENT Provider Note   CSN: [id number] Arrival date & time: 03/17/21  2119     History No chief complaint on file.   Carlos [name] is a 80 y.o. male.  HPI Patient has a history of CAD and presents after a syncopal episode in the lobby.  Patient did have a ground-level fall earlier today approximately 8 hours prior to arrival.  When being brought back from the lobby, patient now ANO x4 GCS 15.  Denies fevers or chills, nausea or vomiting, syncope or shortness of breath at this time.  He did fall mechanically while walking down a hill earlier today and fell and hit his forehead and right arm where he has obvious deformities at both sites.  Patient has no neurovascular compromise and is in no distress at this time.  Denies blood thinner use or any other injuries at this time.  Past Medical History:  Diagnosis Date  . Coronary artery disease 2009   LIMA-LAD, SVG-D1, SVG-OM1-0M2, SVG-PDA    Patient Active Problem List   Diagnosis Date Noted  . Sinus bradycardia on ECG 02/19/2015  . Hyperlipidemia with target LDL less than 70 03/05/2014  . CAD (coronary artery disease) 03/05/2014  . Decreased libido 03/05/2014  . S/P tonsillectomy 1945 03/05/2014    Past Surgical History:  Procedure Laterality Date  . Carotid Duplex  05/04/2008   bilateral high bifurcations, no obvious plaque,   . COLONOSCOPY WITH PROPOFOL N/A 10/03/2015   Procedure: COLONOSCOPY WITH PROPOFOL;  Surgeon: Garlan Fair, MD;  Location: [facility] ENDOSCOPY;  Service: Endoscopy;  Laterality: N/A;  . CORONARY ARTERY BYPASS GRAFT  2009   LIMA-LAD, SVG-D1, SVG-OM1-0M2, SVG-PDA  . NM MYOCAR PERF EJECTION FRACTION  08/24/2009   Protocol:Bruce, post stress EF 68%, exercise cap 13METS, low risk scan  . NM MYOCAR PERF WALL MOTION  08/21/2012   protocol:Bruce post stress EF64%, exercise cap 13METS. low risk scan  . TRANSTHORACIC ECHOCARDIOGRAM  08/24/2009   LV EF55%, normal study  .  TRANSTHORACIC ECHOCARDIOGRAM  05/03/2008   LV 60% aortic valve thickness mild-moderate increase       Family History  Problem Relation Age of Onset  . Heart attack Maternal Grandfather     Social History   Tobacco Use  . Smoking status: Former Smoker    Packs/day: 1.00    Years: 20.00    Pack years: 20.00    Types: Cigarettes  . Smokeless tobacco: Never Used  . Tobacco comment: quit about 15 years ago  Substance Use Topics  . Alcohol use: Yes    Comment: 1 glass of wine daily  . Drug use: No    Home Medications Prior to Admission medications   Medication Sig Start Date End Date Taking? Authorizing Provider  aspirin 81 MG tablet Take 81 mg by mouth daily.    [provider]  BYSTOLIC 2.5 MG tablet TAKE 1 TABLET BY MOUTH EVERY DAY 06/08/20   Troy Sine, MD  Coenzyme Q10 (CO Q-10) 100 MG CAPS Take 1 tablet by mouth daily.    [provider]  ezetimibe (ZETIA) 10 MG tablet TAKE 1 TABLET BY MOUTH EVERY DAY 07/26/20   Troy Sine, MD  glucosamine-chondroitin 500-400 MG tablet Take 1 tablet by mouth 3 (three) times daily.    [provider]  Multiple Vitamin (MULTIVITAMIN WITH MINERALS) TABS tablet Take 1 tablet by mouth daily.    [provider]  rosuvastatin (CRESTOR) 40 MG tablet TAKE 1  TABLET BY MOUTH EVERY DAY 06/08/20   Troy Sine, MD    Allergies    Other  Review of Systems   Review of Systems  Constitutional: Negative for chills and fever.  HENT: Negative for ear pain and sore throat.   Eyes: Negative for pain and visual disturbance.  Respiratory: Negative for cough and shortness of breath.   Cardiovascular: Negative for chest pain and palpitations.  Gastrointestinal: Negative for abdominal pain and vomiting.  Genitourinary: Negative for dysuria and hematuria.  Musculoskeletal: Negative for arthralgias and back pain.  Skin: Negative for color change and rash.  Neurological: Positive for syncope. Negative for seizures.   All other systems reviewed and are negative.   Physical Exam Updated Vital Signs BP (!) 66/42 (BP Location: Left Arm)   Pulse (!) 45   Resp 16   SpO2 98%   Physical Exam Vitals and nursing note reviewed.  Constitutional:      Appearance: He is well-developed.  HENT:     Head: Normocephalic and atraumatic.     Nose: No congestion or rhinorrhea.     Mouth/Throat:     Mouth: Mucous membranes are moist.     Pharynx: Oropharynx is clear. No oropharyngeal exudate.  Eyes:     Conjunctiva/sclera: Conjunctivae normal.     Pupils: Pupils are equal, round, and reactive to light.  Cardiovascular:     Rate and Rhythm: Normal rate and regular rhythm.     Heart sounds: No murmur heard.   Pulmonary:     Effort: Pulmonary effort is normal. No respiratory distress.     Breath sounds: Normal breath sounds.  Abdominal:     Palpations: Abdomen is soft.     Tenderness: There is no abdominal tenderness.  Musculoskeletal:        General: Deformity and signs of injury present. No swelling or tenderness. Normal range of motion.     Cervical back: Neck supple. No rigidity or tenderness.  Skin:    General: Skin is warm and dry.  Neurological:     General: No focal deficit present.     Mental Status: He is alert and oriented to person, place, and time. Mental status is at baseline.     Cranial Nerves: No cranial nerve deficit.     Motor: No weakness.     ED Results / Procedures / Treatments   Labs (all labs ordered are listed, but only abnormal results are displayed) Labs Reviewed - No data to display  EKG None  Radiology No results found.  Procedures Procedures   Medications Ordered in ED Medications - No data to display  ED Course  I have reviewed the triage vital signs and the nursing notes.  Pertinent labs & imaging results that were available during my care of the patient were reviewed by me and considered in my medical decision making (see chart for details).    MDM  Rules/Calculators/A&P                          Patient is an 80 year old male with multiple episodes of syncope presenting after another episode today.  Patient did fall walking down a hill earlier today which he attributes to a mechanical fall.  He fell and hit his right forearm and right head.  When he came in today to be evaluated for pain in his right forearm about 4 hours after the fall, he syncopized while at the front desk.  He did  not hit anything else on his way down and fell backwards.  He denies any amnesia to the event and states that he was awake during and just got very faint.  Notably during the event his blood pressure and heart rate did drop.  Given the nature of his injuries, patient treated as a nonlevel trauma.  Traumatic scans were performed including CTA chest abdomen pelvis, CT head, CT C-spine which resulted with***.  Additionally screening labs were performed with no acute abnormalities at this time. Patient's history of present illness and physical exam findings given the 2 falls in the past 24 hours and syncopal episode are most consistent with cardiogenic syncope given his bradycardia.  However, patient is Final Clinical Impression(s) / ED Diagnoses Final diagnoses:  None    Rx / DC Orders ED Discharge Orders    None

## 2021-03-18 NOTE — Discharge Instructions (Addendum)
You were seen today for passing out and your labs and CT with no acute abnormalities. Your episode of passing out could be from your heart so I strongly recommend you follow up with your PCP in the outpatient setting and get in with your cardilogist. Please return with any changes in your symptoms including fevers or chills nausea or vomitting, syncope or SOB.

## 2021-03-20 ENCOUNTER — Telehealth: Payer: Self-pay | Admitting: Cardiovascular Disease

## 2021-03-20 DIAGNOSIS — R55 Syncope and collapse: Secondary | ICD-10-CM | POA: Diagnosis not present

## 2021-03-20 DIAGNOSIS — S51811A Laceration without foreign body of right forearm, initial encounter: Secondary | ICD-10-CM | POA: Diagnosis not present

## 2021-03-20 DIAGNOSIS — T148XXA Other injury of unspecified body region, initial encounter: Secondary | ICD-10-CM | POA: Diagnosis not present

## 2021-03-20 DIAGNOSIS — I2510 Atherosclerotic heart disease of native coronary artery without angina pectoris: Secondary | ICD-10-CM | POA: Diagnosis not present

## 2021-03-20 DIAGNOSIS — W19XXXA Unspecified fall, initial encounter: Secondary | ICD-10-CM | POA: Diagnosis not present

## 2021-03-20 NOTE — Telephone Encounter (Signed)
Spoke with pt, he has never had problems with his heart rate and blood pressure before. His forearm was significantly swollen and there was a long wait in the ER. He feels the episode in the ER was vagel and so did his medical doctor. The patient would like for dr Claiborne Billings to look at the testing that was done in the hospital to see if he needs to be seen or not. The patient feels fine and does not feel he needs to be seen but wants to make sure before he goes to the beach. Aware will forward to dr Claiborne Billings to review

## 2021-03-20 NOTE — Telephone Encounter (Signed)
Carlos [name] is calling requesting Dr. Claiborne Billings look over his hospital summary from 03/17/21 to see if it would be okay for him to wait until 04/12/21 to see Dr. Claiborne Billings in regards to it, due to that being his first available. Please advise.

## 2021-03-20 NOTE — Telephone Encounter (Signed)
Patient has hi cabg and recently suffer mechanic fall follow by vagal event in ER, on the  exam today his sequela but his vitals are stable and Dr. Ardeth Perfect honestly doesn't think this was any consequence of his previous cabg he ask that dr Claiborne Billings reach out to the patient if wishes to follow up otherwise Dr Ardeth Perfect thinks the patient is clear.

## 2021-03-28 ENCOUNTER — Telehealth: Payer: Self-pay | Admitting: Cardiovascular Disease

## 2021-03-28 DIAGNOSIS — S5011XD Contusion of right forearm, subsequent encounter: Secondary | ICD-10-CM | POA: Diagnosis not present

## 2021-03-28 NOTE — Telephone Encounter (Signed)
Spoke with pt, aware of recommendations. 

## 2021-03-28 NOTE — Telephone Encounter (Signed)
    Pt c/o medication issue:  1. Name of Medication:   aspirin 81 MG tablet    2. How are you currently taking this medication (dosage and times per day)? Take 81 mg by mouth daily.  3. Are you having a reaction (difficulty breathing--STAT)?   4. What is your medication issue? Pt is calling to ask if he can hold his aspirin for a week due to hematoma to his right arm

## 2021-03-28 NOTE — Telephone Encounter (Signed)
Spoke with pt, he was seen in the ER after a fall and he has a swollen right forearm. Everything came out fine with all the test. He reports he has a baseball hematoma on his right arm. He saw the PA at [facility] and it was recommended that he come off the aspirin for 7 days and the patient is calling asking if that will be okay. Patient aware dr Claiborne Billings is not in the office so will forward to the pharm md in the office to review.

## 2021-03-28 NOTE — Telephone Encounter (Signed)
Okay to follow MD recommendation to hold for 5 days. Resume as soon as possible.

## 2021-03-30 NOTE — Telephone Encounter (Signed)
Nurse called to offer a sooner on 5/16. Pt state he is already at the beach and would prefer to keep 5/25 appointment.  Pt made aware of ED precaution should any new symptoms develop or worsen. Pt verbalized understanding.

## 2021-03-30 NOTE — Telephone Encounter (Signed)
Please see updated encounter  

## 2021-04-23 ENCOUNTER — Other Ambulatory Visit: Payer: Self-pay | Admitting: Cardiovascular Disease

## 2021-06-07 ENCOUNTER — Other Ambulatory Visit: Payer: Self-pay | Admitting: Cardiovascular Disease

## 2021-06-18 ENCOUNTER — Other Ambulatory Visit: Payer: Self-pay | Admitting: Cardiovascular Disease

## 2021-06-27 ENCOUNTER — Encounter: Payer: Self-pay | Admitting: Cardiovascular Disease

## 2021-06-27 ENCOUNTER — Ambulatory Visit (INDEPENDENT_AMBULATORY_CARE_PROVIDER_SITE_OTHER): Payer: Medicare Other | Admitting: Cardiovascular Disease

## 2021-06-27 ENCOUNTER — Other Ambulatory Visit: Payer: Self-pay

## 2021-06-27 DIAGNOSIS — U071 COVID-19: Secondary | ICD-10-CM | POA: Diagnosis not present

## 2021-06-27 DIAGNOSIS — I2510 Atherosclerotic heart disease of native coronary artery without angina pectoris: Secondary | ICD-10-CM | POA: Diagnosis not present

## 2021-06-27 DIAGNOSIS — R001 Bradycardia, unspecified: Secondary | ICD-10-CM | POA: Diagnosis not present

## 2021-06-27 DIAGNOSIS — I10 Essential (primary) hypertension: Secondary | ICD-10-CM | POA: Diagnosis not present

## 2021-06-27 DIAGNOSIS — E785 Hyperlipidemia, unspecified: Secondary | ICD-10-CM | POA: Diagnosis not present

## 2021-06-27 DIAGNOSIS — Z951 Presence of aortocoronary bypass graft: Secondary | ICD-10-CM | POA: Diagnosis not present

## 2021-06-27 NOTE — Progress Notes (Signed)
Patient ID: Carlos [name], male   DOB: 1941/06/22, 80 y.o.   MRN: [id number]      Primary M.D.: Dr. Loleta Books  HPI: Carlos [name] is a 80 year old gentleman was born in [place], [place], and worked for [employer].  He is a former patient of Dr. Rollene Fare.  [name] in July 2021.  He presents for a one year follow-up evaluation.   Carlos [name] has has CAD and in June 2009 underwent CABG surgery x5 by Dr. Gilford Raid after presenting with a non-ST segment elevation myocardial infarction. A LIMA was placed to the LAD, a  SVG to the second diagonal branch of the LAD, sequential vein graft to the first and second obtuse marginal branches of the circumflex coronary artery, and vein graft to the PDA branch of the RCA with endoscopic vein harvesting from both thighs.  He is not undergone cardiac catheterizations since that time.  His last nuclear perfusion study in October 2013 did not show any ischemia. In October 2014, he underwent carotid duplex imaging, which showed mild plaque without significant stenoses, bilaterally.  Laboratory in September 2014 showed a TC 132, triglycerides 72, LDL 74, HDL 44 on  Crestor 40 mg in addition to Niaspan 500 mg.  There was no evidence for anemia.  Glucose was minimally increased at 153, TSH was normal as was his PSA.  An echo Doppler study in October 2014 showed an ejection fraction of 60-65% with normal wall motion.  He retired from [employer] in November 2016 and continues to be active.  A nuclear stress test in 02/22/2016 was low risk and had mild attenuation artifact in the apical lateral segment.  There was no ischemia.  Ejection fraction was 55% with normal wall motion.    Laboratory from February 19, 2017 was normal.  Lipid studies were excellent with cholesterol 132, triglycerides 71, HDL 44, VLDL 14, and LDL 74.  When saw him in June 2019 he denied any episodes of chest pain.  He continued to be active and was walking 2 to 3 miles a day and exercises in a gym at least  3 days/week both doing cardiac and resistance training.  He exercises at the [facility].  Had undergone laboratory in December 2018 which showed a total cholesterol 155, HDL 43, LDL 94, triglycerides 89.  He was on  rosuvastatin 40 mg in addition to nebivolol 2.5 mg and aspirin 81 mg.  He denies any chest pain, palpitations, PND orthopnea, presyncope or syncope.  During that evaluation I added Zetia 10 mg with a goal to achieve an LDL less than 70.  Since [name], he was evaluated in a telemedicine visit with Rosaria Ferries in July 2020.  Since his last evaluation with me, he states that he believes he had 2 separate Covid infections.  He believed in February 80 he developed Covid symptomatology which lasted for almost 2 to 4 weeks.  This year he had been vaccinated and again in June 80 he experienced several days of recurrent symptomatology and his wife also experienced more prolonged symptoms following exposure from their grandchild.  He had undergone laboratory by Dr. Ardeth Perfect in December 2015 which showed an LDL cholesterol at 74.  Creatinine was 1.0.  BUN 12.  [name] in July 2021.  At that time he had not been as active as he had in the past secondary to issues with his hip as well as his right Achilles heel.  However he has  undergone rehab and now is resuming increased activity.  He deniesd any recurrent anginal symptoms.  He denied any recent fever chills or night sweats but even with his most recent infection in June he did experience fever and cough.  He was unaware of palpitations.  He deniesdany exertional dyspnea presyncope or syncope.  During that evaluation, I recommended he undergo a follow-up echo Doppler study since his last echo was in 2014 and he had 2 previous COVID infections.  His echo Doppler study was done on July 20, 2020 which showed normal LV function with EF 60 to 65%.  There was mild aortic valve stenosis with moderate calcification and thickening.  There was  no aortic insufficiency.  In April 2022 he was walking on a trail at a brisk pace and apparently tripped.  He landed hard to the ground and sustained injury to his right arm forearm with significant bruising.  When he went home walking 1 more mile after he had fallen, he began to have significant swelling and pain and may have experienced a vagal episode leading to syncope.  He [facility] ER.  He was hypotensive and bradycardic upon arrival which responded to intravenous fluids.  He had a series of CT scans and x-rays which showed soft tissue swelling without broken bones.  On his head CT there was generalized cerebral atrophy.  There was right supraorbital and right frontal scalp soft tissue swelling without acute osseous abnormality.  His abdominal CT showed mild bilateral lower lobe linear scarring and/or atelectasis.  There was sigmoid diverticulosis.  His prostate gland was enlarged and there was evidence for aortic atherosclerosis.  Presently, he feels well.  He denies any chest pain or shortness of breath.  His pulse typically runs in the 50s.  He denies any further presyncope or syncope.  He presents for evaluation.  Past surgical history is notable for his CABG surgery, tonsillectomy, and tear duct surgery.   Current Outpatient Medications  Medication Sig Dispense Refill   aspirin 81 MG tablet Take 81 mg by mouth daily.     Coenzyme Q10 (CO Q-10) 100 MG CAPS Take 100 mg by mouth daily.     ezetimibe (ZETIA) 10 MG tablet TAKE 1 TABLET BY MOUTH EVERY DAY 60 tablet 0   GLUCOSAMINE-CHONDROITIN PO Take 1 tablet by mouth 2 (two) times daily. 1500/$RemoveBeforeDEI'1103mg'[id number]$  per patient     Multiple Vitamin (MULTIVITAMIN WITH MINERALS) TABS tablet Take 1 tablet by mouth daily.     nebivolol (BYSTOLIC) 2.5 MG tablet TAKE 1 TABLET BY MOUTH EVERY DAY 90 tablet 3   rosuvastatin (CRESTOR) 40 MG tablet TAKE 1 TABLET BY MOUTH EVERY DAY 90 tablet 3   No current facility-administered medications for this visit.     Socially, he is married. He has 2 children.  He worked for [employer] with [employer] worked a total of 59 years, retiring at age 68..  As a master's degree.  He did previously smoke cigarettes for 20 years, but quit in 1995.  He does drink wine.  He does exercise at minimum 3 days per week.  Family History  Problem Relation Age of Onset   Heart attack Maternal Grandfather    ROS General: Negative; No fevers, chills, or night sweats;  HEENT: Negative; No changes in vision or hearing, sinus congestion, difficulty swallowing Pulmonary: Negative; No cough, wheezing, shortness of breath, hemoptysis Cardiovascular: See history of present illness GI: Negative; No nausea , vomiting, diarrhea, or abdominal pain GU: He has had  some issues with decreased libido. Musculoskeletal: Right hip and Achilles discomfort Hematologic/Oncology: Negative; no easy bruising, bleeding Endocrine: Negative; no heat/cold intolerance; no diabetes Neuro: Negative; no changes in balance, headaches Skin: Negative; No rashes or skin lesions Psychiatric: Negative; No behavioral problems, depression Sleep: Negative; No snoring, daytime sleepiness, hypersomnolence, bruxism, restless legs, hypnogognic hallucinations, no cataplexy Other comprehensive 14 point system review is negative.  PE BP 122/62   Pulse (!) 50   Resp 12   Ht $R'5\' 8"'Or$  (1.727 m)   Wt 188 lb 9.6 oz (85.5 kg)   BMI 28.68 kg/m    Repeat blood pressure by me was 128/76  Wt Readings from Last 3 Encounters:  06/27/21 188 lb 9.6 oz (85.5 kg)  03/17/21 182 lb (82.6 kg)  06/15/20 189 lb 6.4 oz (85.9 kg)   General: Alert, oriented, no distress.  Skin: normal turgor, no rashes, warm and dry HEENT: Normocephalic, atraumatic. Pupils equal round and reactive to light; sclera anicteric; extraocular muscles intact;  Nose without nasal septal hypertrophy Mouth/Parynx benign; Mallinpatti scale 3 Neck: No JVD, no carotid bruits; normal carotid upstroke Lungs:  clear to ausculatation and percussion; no wheezing or rales Chest wall: without tenderness to palpitation Heart: PMI not displaced, RRR, s1 s2 normal, 1-2/6 early peaking systolic murmur, no diastolic murmur, no rubs, gallops, thrills, or heaves Abdomen: soft, nontender; no hepatosplenomehaly, BS+; abdominal aorta nontender and not dilated by palpation. Back: no CVA tenderness Pulses 2+ Musculoskeletal: full range of motion, normal strength, no joint deformities Extremities: no clubbing cyanosis or edema, Homan's sign negative  Neurologic: grossly nonfocal; Cranial nerves grossly wnl Psychologic: Normal mood and affect   ECG (independently read by me):  Sinus bradycardia at 51, RV conduction delay  July 28, 20212 ECG (independently read by me): Sinus rhythm at 63 bpm.  Mild RV conduction delay.  Normal intervals.  June 2019 ECG (independently read by me): This bradycardia 50 bpm.  First-degree AV block with a PR interval of 218 ms.  RV conduction delay.  February 25, 2017 ECG (independently read by me): Sinus bradycardia 53 bpm.  Mild RV conduction delay.  Borderline first-degree AV block with a PR interval 206.  We seconds.  April 2017 ECG (independently read by me): Sinus bradycardia at 49 bpm with first-degree AV block.  PR interval 212 ms.  QTc interval 433 ms.  Incomplete right bundle branch block.  March 2015 ECG (independently read by me): Sinus bradycardia 53 beats per minute.  No ectopy.  No significant change from his prior ECG from September 2014  LABS: Blood work at [facility] on 10/12/2015. BUN/creatinine 15/1.0.  Hemoglobin/hematocrit 14.8/44.5 Total cholesterol 145, triglycerides 65, HDL 40, LDL 92.  I reviewed most recent lab work from November 03, 2019 which showed total cholesterol 135, HDL 45, LDL 74, triglycerides 81. BUN 12, creatinine 1.0 TSH 1.3 PSA 1.9  BMET  BMP Latest Ref Rng & Units 03/17/2021 03/17/2021 07/25/2018  Glucose 70 - 99 mg/dL 120(H)  120(H) 86  BUN 8 - 23 mg/dL $Remove'19 17 20  'KDHIOwM$ Creatinine 0.61 - 1.24 mg/dL 1.10 1.09 1.15  BUN/Creat Ratio 10 - 24 - - 17  Sodium 135 - 145 mmol/L 142 138 141  Potassium 3.5 - 5.1 mmol/L 4.1 4.1 4.7  Chloride 98 - 111 mmol/L 108 108 104  CO2 22 - 32 mmol/L - 22 23  Calcium 8.9 - 10.3 mg/dL - 9.3 9.0    Hepatic Function Latest Ref Rng & Units 03/17/2021 07/25/2018 02/19/2017  Total  Protein 6.5 - 8.1 g/dL 7.0 6.5 6.7  Albumin 3.5 - 5.0 g/dL 3.9 4.1 3.9  AST 15 - 41 U/L 32 26 29  ALT 0 - 44 U/L $Remo'26 22 24  'qhBCF$ Alk Phosphatase 38 - 126 U/L 48 53 52  Total Bilirubin 0.3 - 1.2 mg/dL 0.7 0.7 0.8     CBC Latest Ref Rng & Units 03/17/2021 03/17/2021 02/19/2017  WBC 4.0 - 10.5 K/uL - 13.5(H) 5.6  Hemoglobin 13.0 - 17.0 g/dL 15.0 14.8 14.8  Hematocrit 39.0 - 52.0 % 44.0 44.6 44.3  Platelets 150 - 400 K/uL - 179 169    Lab Results  Component Value Date   MCV 95.1 03/17/2021   MCV 95.9 02/19/2017   MCV 93.3 08/13/2013   Lab Results  Component Value Date   TSH 2.14 02/19/2017    BNP    Component Value Date/Time   PROBNP <30.0 05/01/2008 0420     Lipid Panel     Component Value Date/Time   CHOL 122 07/25/2018 0822   TRIG 59 07/25/2018 0822   HDL 49 07/25/2018 0822   CHOLHDL 2.5 07/25/2018 0822   CHOLHDL 3.0 02/19/2017 0857   VLDL 14 02/19/2017 0857   LDLCALC 61 07/25/2018 0822     RADIOLOGY: No results found.  IMPRESSION:  1. Coronary artery disease involving native coronary artery of native heart without angina pectoris   2. Hx of CABG   3. Essential hypertension   4. Hyperlipidemia with target LDL less than 70   5. COVID-19 virus infection: ? 2/20 and 6/21   6. Sinus bradycardia on ECG     ASSESSMENT AND PLAN: Carlos [name] is an 80 year old gentleman who suffered a NSTEMI  in June 2009 and underwent CABG surgery x5 by Dr. Cyndia Bent.  A nuclear perfusion study in October 2013 continued to show normal perfusion without scar or ischemia. A four-year follow-up study in April 2017 showed  essentially normal perfusion, although minimal apical lateral defect was present.  This most likely is artifact.  There was no reversible ischemia.  Ejection fraction was 55% with normal wall motion.  Over the last several years, he has remained stable.  He apparently had 2 separate COVID-19 infections with his initial episode in late February 2020 which she had symptoms for at least 2 weeks.  He subsequently was vaccinated this year but had a recent infection in early June 2021 after exposure from his 28-year-old grandson.  When I saw him in 2021 recommended he undergo follow-up echocardiographic evaluation.  I reviewed the echo findings with him in detail today.  This showed normal systolic function with an EF of 60 to 65% with mild LVH.  There was mild aortic stenosis without regurgitation.  There was moderate calcification of the valve.  Presently, his blood pressure is stable and a repeat by me was 122/70 supine and 116/70 standing.  With his bradycardia and heart rate at 50, I have suggested he wean and DC his low-dose nebivolol which currently is 2.5 mg daily.  I have suggested he reduce this to one half for the next 2 days, and then take one half every other day for 2 cycles then discontinuance.  He will continue to monitor his heart rate and blood pressure at home.  He continues to be on rosuvastatin 40 mg for hyperlipidemia in addition to Zetia with target LDL less than 70.  He is followed by Dr. Ardeth Perfect for his primary care who will be rechecking laboratory.  I will see  him in 1 year for reevaluation or sooner as needed.   Troy Sine, MD, [facility] 06/27/2021 3:05 PM

## 2021-06-27 NOTE — Patient Instructions (Signed)
Medication Instructions:  For two days take 1.'25mg'$  Bystolic (1/2 tablet). Following this, skip a day, then take 1.'25mg'$  (1/2 tablet), then skip a day, then take 1.'25mg'$  (1/2 tablet), then STOP Bystolic.  *If you need a refill on your cardiac medications before your next appointment, please call your pharmacy*   Lab Work: None ordered.   If you have labs (blood work) drawn today and your tests are completely normal, you will receive your results only by: [place] (if you have MyChart) OR A paper copy in the mail If you have any lab test that is abnormal or we need to change your treatment, we will call you to review the results.   Testing/Procedures: To be scheduled in 1 year at [facility]. Your physician has requested that you have an echocardiogram. Echocardiography is a painless test that uses sound waves to create images of your heart. It provides your doctor with information about the size and shape of your heart and how well your heart's chambers and valves are working. This procedure takes approximately one hour. There are no restrictions for this procedure.    Follow-Up: At [facility], you and your health needs are our priority.  As part of our continuing mission to provide you with exceptional heart care, we have created designated Provider Care Teams.  These Care Teams include your primary Cardiologist (physician) and Advanced Practice Providers (APPs -  Physician Assistants and Nurse Practitioners) who all work together to provide you with the care you need, when you need it.  We recommend signing up for the patient portal called "MyChart".  Sign up information is provided on this After Visit Summary.  MyChart is used to connect with patients for Virtual Visits (Telemedicine).  Patients are able to view lab/test results, encounter notes, upcoming appointments, etc.  Non-urgent messages can be sent to your provider as well.   To learn more about what you can do with  MyChart, go to [email or web address].    Your next appointment:   12 month(s), return following your echocardiogram.   The format for your next appointment:   In Person  Provider:   Shelva Majestic, MD

## 2021-08-14 ENCOUNTER — Other Ambulatory Visit: Payer: Self-pay | Admitting: Cardiovascular Disease

## 2021-09-12 DIAGNOSIS — Z23 Encounter for immunization: Secondary | ICD-10-CM | POA: Diagnosis not present

## 2021-09-25 IMAGING — DX DG FOREARM 2V*R*
2 series · 2 of 2 positions shown · non-contrast
Comparison: None.

CLINICAL DATA: Status post trauma.

EXAM:
RIGHT FOREARM - 2 VIEW

[forearm ap]
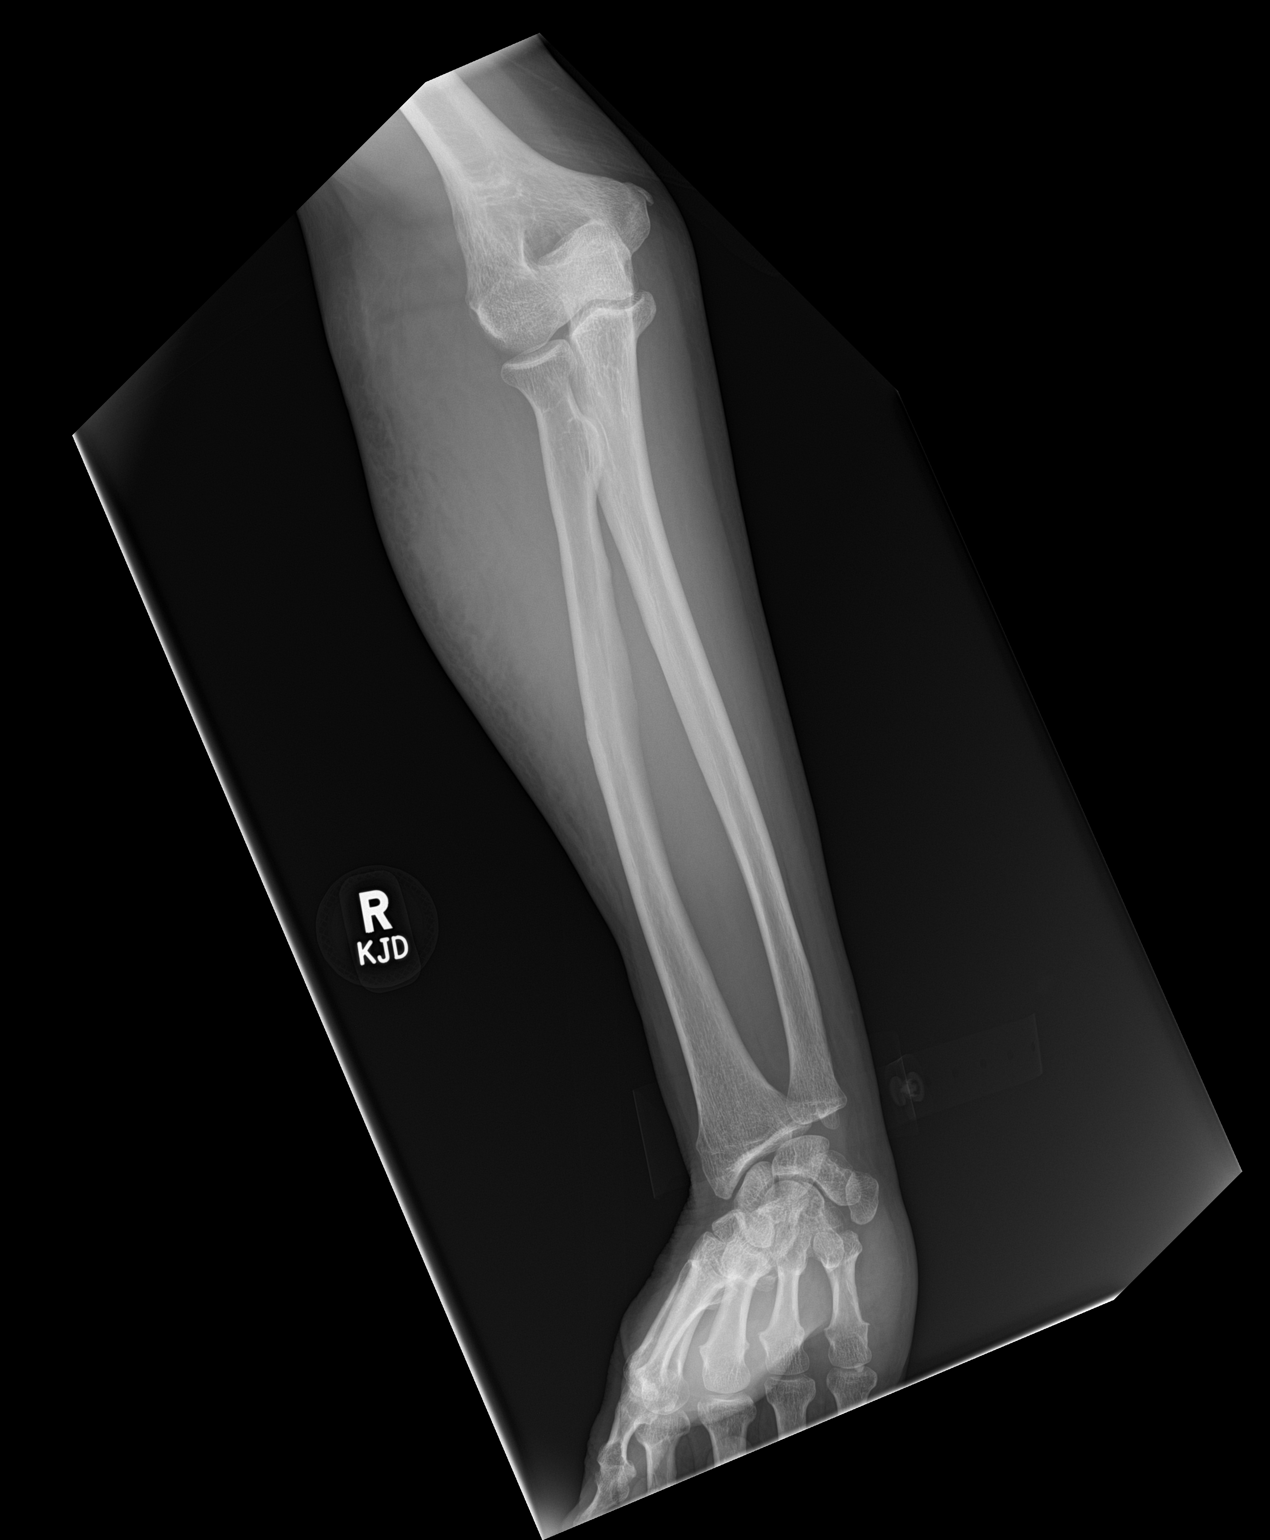

[forearm lat]
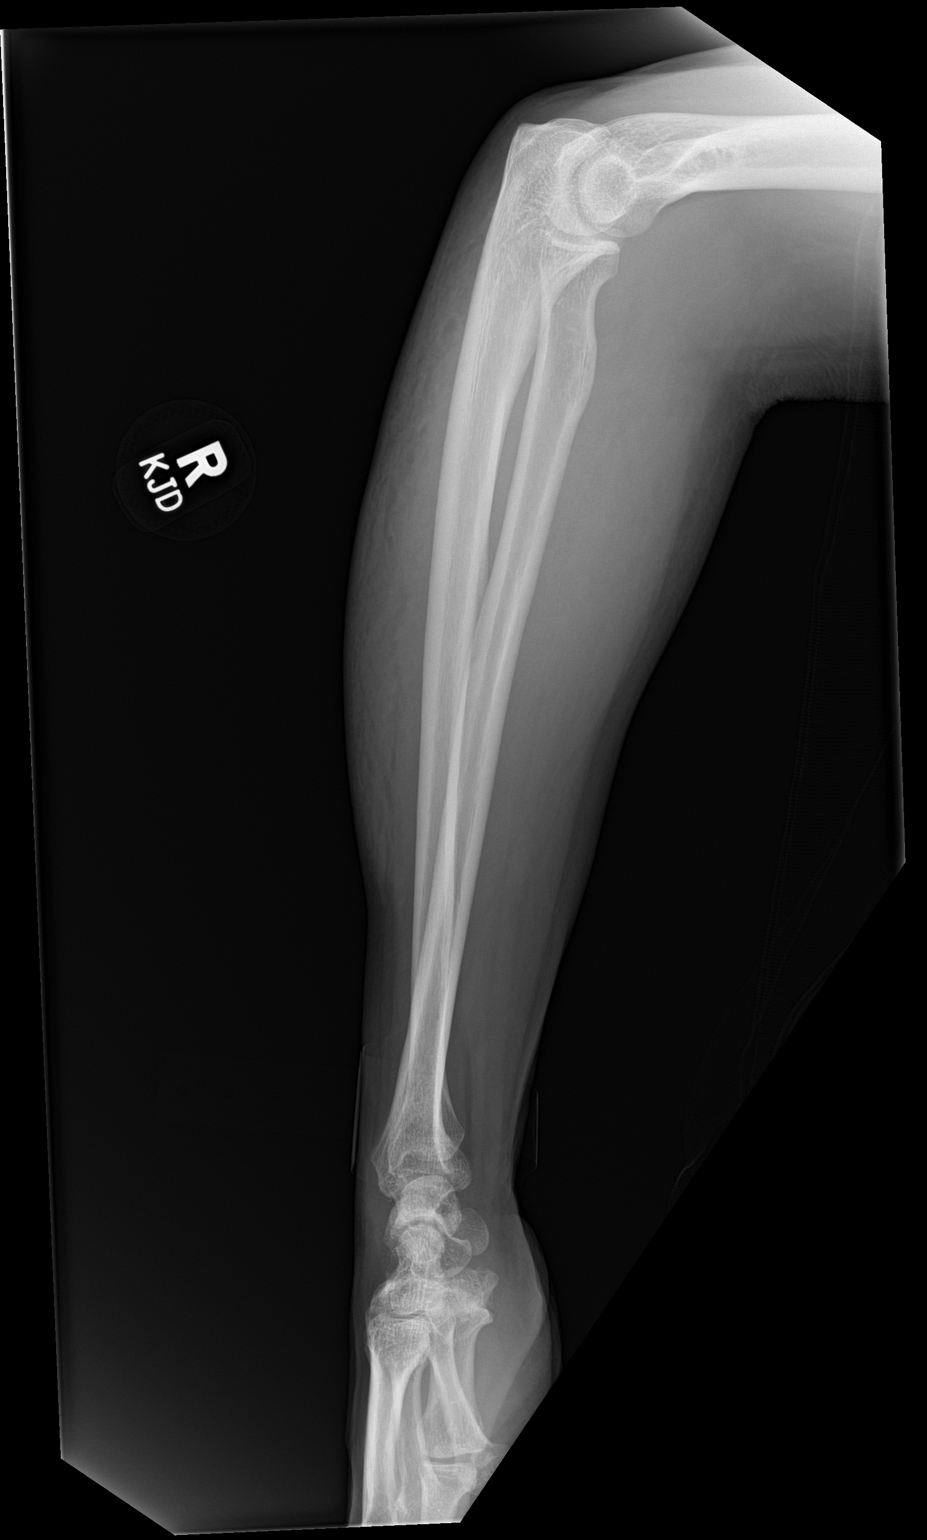

[2 of 2 positions shown; findings below may reference images not displayed]

FINDINGS: There is no evidence of an acute fracture or dislocation. A small,
chronic appearing cortical density is seen adjacent to the lateral
epicondyle of the distal right humerus. Mild to moderate severity
soft tissue swelling is seen along the volar aspect of the proximal
to mid right forearm.
IMPRESSION: Volar soft tissue swelling, without an acute osseous abnormality.

## 2021-09-25 IMAGING — CT CT CHEST W/ CM
2 of 4 series · 15 of 46 positions shown, 17 images · IV contrast (omnipaque)
Comparison: None.

CLINICAL DATA: Status post trauma.

EXAM:
CT CHEST, ABDOMEN, AND PELVIS WITH CONTRAST
TECHNIQUE: Multidetector CT imaging of the chest, abdomen and pelvis was
performed following the standard protocol during bolus
administration of intravenous contrast.
CONTRAST:  100mL OMNIPAQUE IOHEXOL 300 MG/ML  SOLN

[Series 3: cap with · axial · 0.79mm/px · z∈[-870,-285]mm · 12 of 137 slices shown, 14 images]
[im 10/137  soft-tissue]
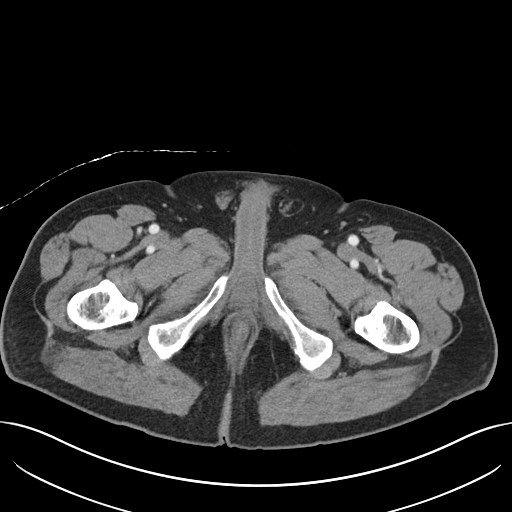
[im 10/137  bone]
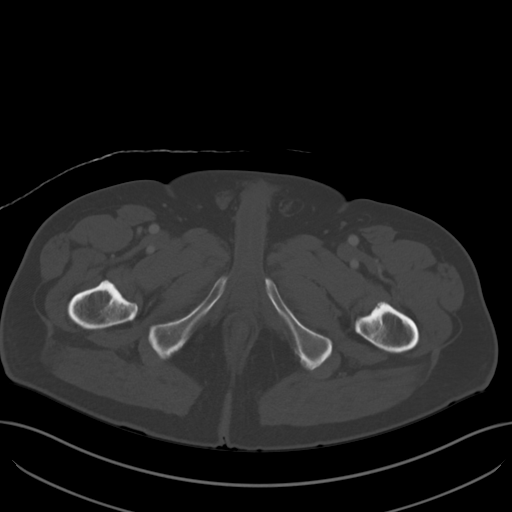
[im 20/137  soft-tissue]
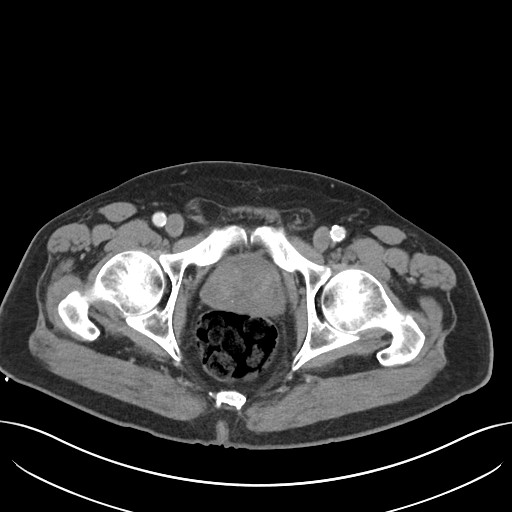
[im 30/137  soft-tissue]
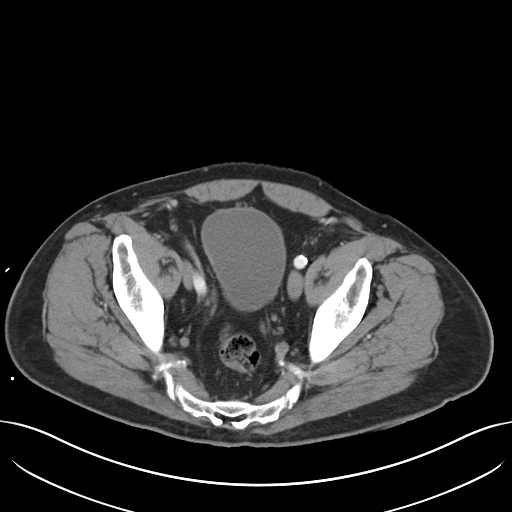
[im 39/137  soft-tissue]
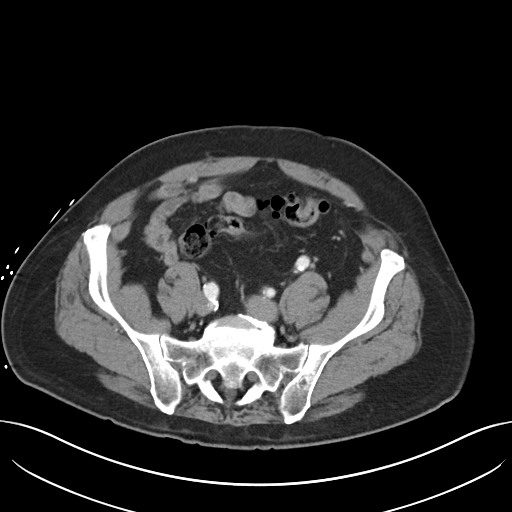
[im 49/137  soft-tissue]
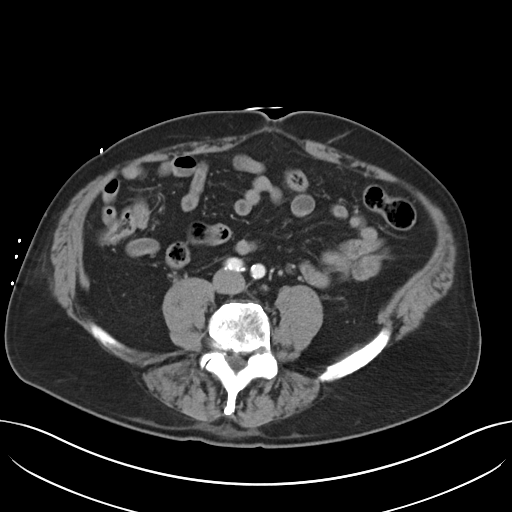
[im 59/137  soft-tissue]
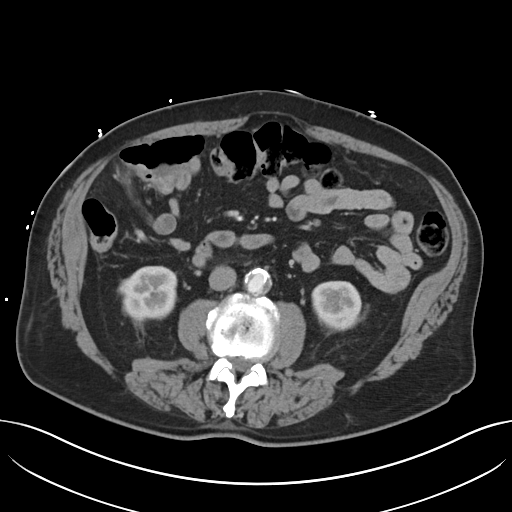
[im 78/137  soft-tissue]
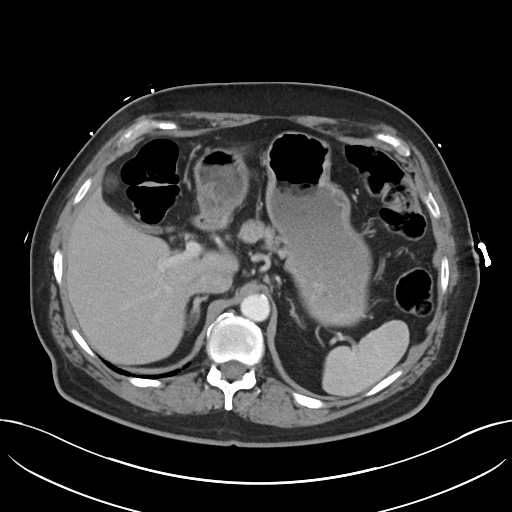
[im 88/137  soft-tissue]
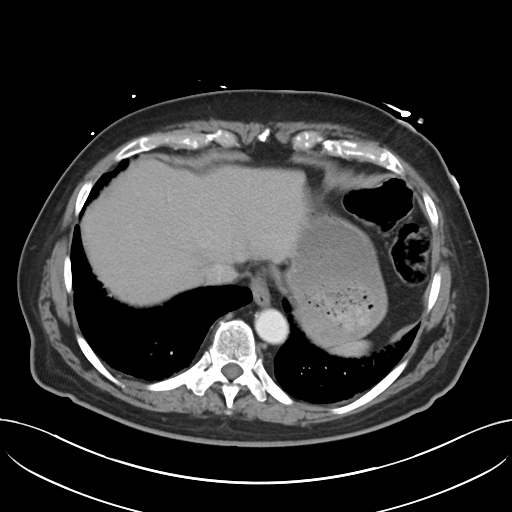
[im 98/137  soft-tissue]
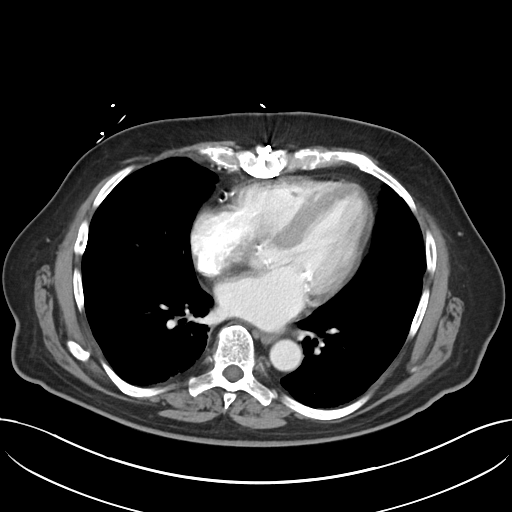
[im 98/137  bone]
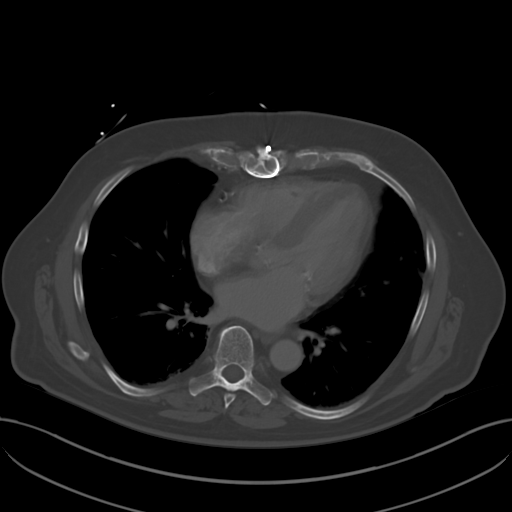
[im 107/137  soft-tissue]
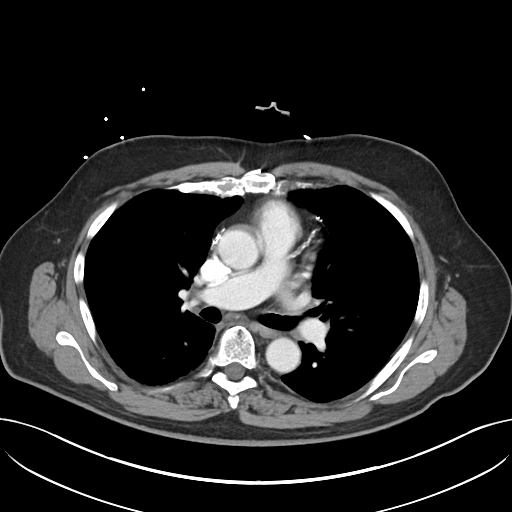
[im 117/137  soft-tissue]
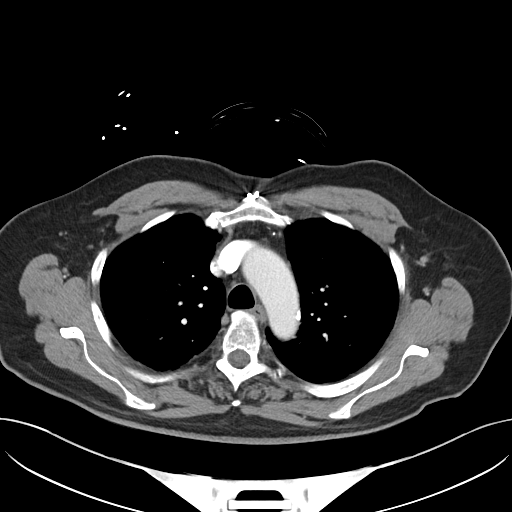
[im 127/137  soft-tissue]
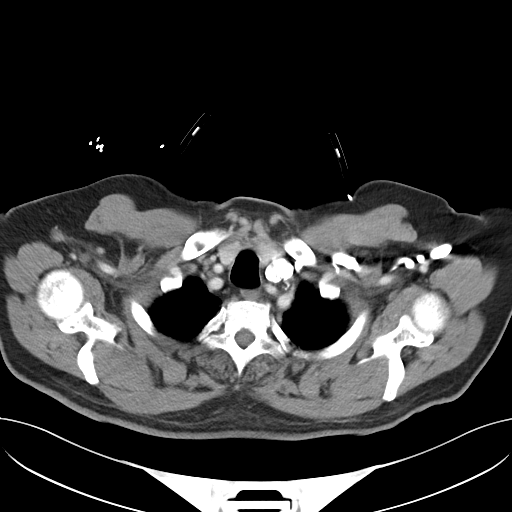

[Series 6: cor · coronal · 0.96mm/px · 3 of 100 slices shown]
[im 34/100  soft-tissue]
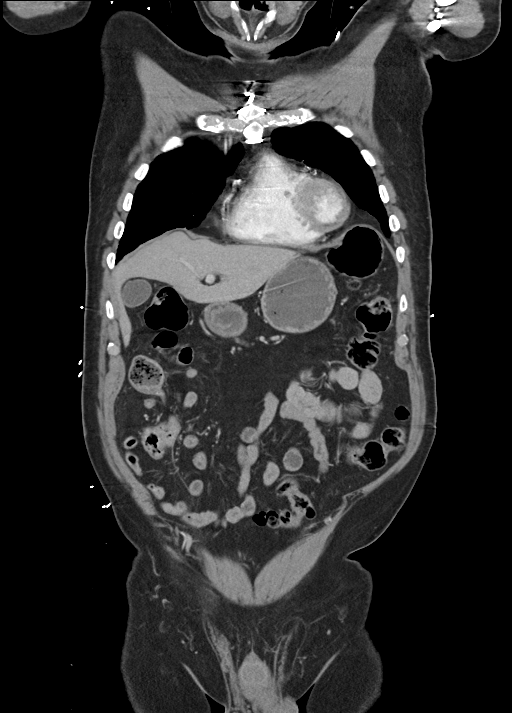
[im 45/100  soft-tissue]
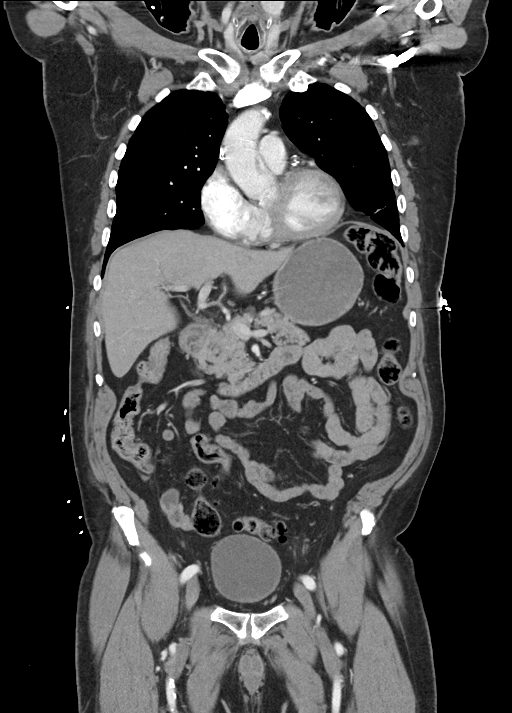
[im 56/100  soft-tissue]
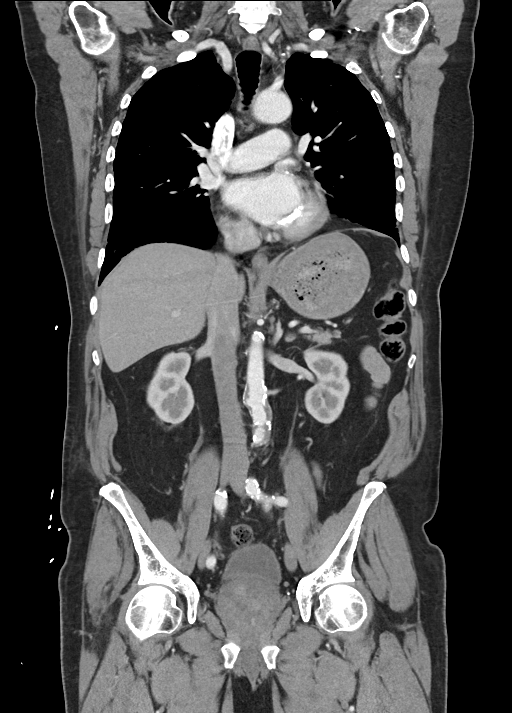

[15 of 46 positions shown; findings below may reference images not displayed]

FINDINGS: CT CHEST FINDINGS

Cardiovascular: There is mild calcification of the aortic arch.
Normal heart size with marked severity coronary artery
calcification. Coronary artery surgical clips are also noted. No
pericardial effusion.

Mediastinum/Nodes: No enlarged mediastinal, hilar, or axillary lymph
nodes. Thyroid gland, trachea, and esophagus demonstrate no
significant findings.

Lungs/Pleura: Mild areas of linear scarring and/or atelectasis are
seen within the bilateral lower lobes and inferior aspect of the
left upper lobe.

There is no evidence of a pleural effusion or pneumothorax.

Musculoskeletal: Multiple sternal wires are noted.

Degenerative changes are seen throughout the thoracic spine.

CT ABDOMEN PELVIS FINDINGS

Hepatobiliary: No focal liver abnormality is seen. No gallstones,
gallbladder wall thickening, or biliary dilatation.

Pancreas: Unremarkable. No pancreatic ductal dilatation or
surrounding inflammatory changes.

Spleen: Normal in size without focal abnormality.

Adrenals/Urinary Tract: Adrenal glands are unremarkable. Kidneys are
normal, without renal calculi, focal lesion, or hydronephrosis.
Bladder is unremarkable.

Stomach/Bowel: Stomach is within normal limits. Appendix appears
normal. No evidence of bowel wall thickening, distention, or
inflammatory changes. Noninflamed diverticula are seen throughout
the sigmoid colon.

Vascular/Lymphatic: Aortic atherosclerosis. No enlarged abdominal or
pelvic lymph nodes.

Reproductive: The prostate gland is markedly enlarged.

Other: No abdominal wall hernia or abnormality. No abdominopelvic
ascites.

Musculoskeletal: Moderate to marked severity multilevel degenerative
changes are seen throughout the lumbar spine.
IMPRESSION: 1. Mild left upper lobe and bilateral lower lobe linear scarring
and/or atelectasis.
2. Evidence of prior median sternotomy/CABG.
3. Sigmoid diverticulosis.
4. Markedly enlarged prostate gland.
5. Aortic atherosclerosis.

## 2022-02-13 ENCOUNTER — Other Ambulatory Visit: Payer: Self-pay | Admitting: Cardiovascular Disease

## 2022-06-13 ENCOUNTER — Other Ambulatory Visit: Payer: Self-pay | Admitting: Cardiovascular Disease

## 2022-06-27 ENCOUNTER — Ambulatory Visit (HOSPITAL_COMMUNITY): Payer: Medicare Other | Attending: Cardiology

## 2022-06-27 DIAGNOSIS — Z951 Presence of aortocoronary bypass graft: Secondary | ICD-10-CM | POA: Diagnosis not present

## 2022-06-27 DIAGNOSIS — I2510 Atherosclerotic heart disease of native coronary artery without angina pectoris: Secondary | ICD-10-CM | POA: Diagnosis not present

## 2022-06-27 DIAGNOSIS — I10 Essential (primary) hypertension: Secondary | ICD-10-CM | POA: Insufficient documentation

## 2022-06-27 LAB — ECHOCARDIOGRAM COMPLETE
AR max vel: 1 cm2
AV Area VTI: 1.02 cm2
AV Area mean vel: 1.04 cm2
AV Mean grad: 17 mmHg
AV Peak grad: 27.5 mmHg
Ao pk vel: 2.62 m/s
Area-P 1/2: 1.86 cm2
S' Lateral: 2.8 cm

## 2022-06-27 MED ORDER — PERFLUTREN LIPID MICROSPHERE
1.0000 mL | INTRAVENOUS | Status: AC | PRN
  Administered 2022-06-27: 1 mL via INTRAVENOUS

## 2022-08-21 ENCOUNTER — Ambulatory Visit: Payer: Medicare Other | Attending: Cardiovascular Disease | Admitting: Cardiovascular Disease

## 2022-08-21 ENCOUNTER — Encounter: Payer: Self-pay | Admitting: Cardiovascular Disease

## 2022-08-21 DIAGNOSIS — I3481 Nonrheumatic mitral (valve) annulus calcification: Secondary | ICD-10-CM

## 2022-08-21 DIAGNOSIS — I10 Essential (primary) hypertension: Secondary | ICD-10-CM

## 2022-08-21 DIAGNOSIS — I350 Nonrheumatic aortic (valve) stenosis: Secondary | ICD-10-CM

## 2022-08-21 DIAGNOSIS — I2510 Atherosclerotic heart disease of native coronary artery without angina pectoris: Secondary | ICD-10-CM

## 2022-08-21 DIAGNOSIS — Z951 Presence of aortocoronary bypass graft: Secondary | ICD-10-CM

## 2022-08-21 DIAGNOSIS — E785 Hyperlipidemia, unspecified: Secondary | ICD-10-CM

## 2022-08-21 NOTE — Progress Notes (Unsigned)
Patient ID: Carlos [name], male   DOB: 1940/12/08, 81 y.o.   MRN: [id number]       Primary M.D.: Dr. Loleta Books  HPI: Carlos [name] is a 81 year old gentleman was born in [place], [place], and worked for [employer].  He is a former patient of Dr. Rollene Fare.  [name] in July 2021.  He presents for a one year follow-up evaluation.   Carlos [name] has has CAD and in June 2009 underwent CABG surgery x5 by Dr. Gilford Raid after presenting with a non-ST segment elevation myocardial infarction. A LIMA was placed to the LAD, a  SVG to the second diagonal branch of the LAD, sequential vein graft to the first and second obtuse marginal branches of the circumflex coronary artery, and vein graft to the PDA branch of the RCA with endoscopic vein harvesting from both thighs.  He is not undergone cardiac catheterizations since that time.  His last nuclear perfusion study in October 2013 did not show any ischemia. In October 2014, he underwent carotid duplex imaging, which showed mild plaque without significant stenoses, bilaterally.  Laboratory in September 2014 showed a TC 132, triglycerides 72, LDL 74, HDL 44 on  Crestor 40 mg in addition to Niaspan 500 mg.  There was no evidence for anemia.  Glucose was minimally increased at 153, TSH was normal as was his PSA.  An echo Doppler study in October 2014 showed an ejection fraction of 60-65% with normal wall motion.  He retired from [employer] in November 2016 and continues to be active.  A nuclear stress test in 02/22/2016 was low risk and had mild attenuation artifact in the apical lateral segment.  There was no ischemia.  Ejection fraction was 55% with normal wall motion.    Laboratory from February 19, 2017 was normal.  Lipid studies were excellent with cholesterol 132, triglycerides 71, HDL 44, VLDL 14, and LDL 74.  When saw him in June 2019 he denied any episodes of chest pain.  He continued to be active and was walking 2 to 3 miles a day and exercises in a gym at  least 3 days/week both doing cardiac and resistance training.  He exercises at the [facility].  Had undergone laboratory in December 2018 which showed a total cholesterol 155, HDL 43, LDL 94, triglycerides 89.  He was on  rosuvastatin 40 mg in addition to nebivolol 2.5 mg and aspirin 81 mg.  He denies any chest pain, palpitations, PND orthopnea, presyncope or syncope.  During that evaluation I added Zetia 10 mg with a goal to achieve an LDL less than 70.  Since [name], he was evaluated in a telemedicine visit with Rosaria Ferries in July 2020.  Since his last evaluation with me, he states that he believes he had 2 separate Covid infections.  He believed in February 2020 he developed Covid symptomatology which lasted for almost 2 to 4 weeks.  This year he had been vaccinated and again in June 2021 he experienced several days of recurrent symptomatology and his wife also experienced more prolonged symptoms following exposure from their grandchild.  He had undergone laboratory by Dr. Ardeth Perfect in December 2015 which showed an LDL cholesterol at 74.  Creatinine was 1.0.  BUN 12.  [name] in July 2021.  At that time he had not been as active as he had in the past secondary to issues with his hip as well as his right Achilles heel.  However he  has undergone rehab and now is resuming increased activity.  He deniesd any recurrent anginal symptoms.  He denied any recent fever chills or night sweats but even with his most recent infection in June he did experience fever and cough.  He was unaware of palpitations.  He deniesdany exertional dyspnea presyncope or syncope.  During that evaluation, I recommended he undergo a follow-up echo Doppler study since his last echo was in 2014 and he had 2 previous COVID infections.  His echo Doppler study was done on July 20, 2020 which showed normal LV function with EF 60 to 65%.  There was mild aortic valve stenosis with moderate calcification and thickening.  There  was no aortic insufficiency.  In April 2022 he was walking on a trail at a brisk pace and apparently tripped.  He landed hard to the ground and sustained injury to his right arm forearm with significant bruising.  When he went home walking 1 more mile after he had fallen, he began to have significant swelling and pain and may have experienced a vagal episode leading to syncope.  He presented to [facility] ER.  He was hypotensive and bradycardic upon arrival which responded to intravenous fluids.  He had a series of CT scans and x-rays which showed soft tissue swelling without broken bones.  On his head CT there was generalized cerebral atrophy.  There was right supraorbital and right frontal scalp soft tissue swelling without acute osseous abnormality.  His abdominal CT showed mild bilateral lower lobe linear scarring and/or atelectasis.  There was sigmoid diverticulosis.  His prostate gland was enlarged and there was evidence for aortic atherosclerosis.  Presently, he feels well.  He denies any chest pain or shortness of breath.  His pulse typically runs in the 50s.  He denies any further presyncope or syncope.  He presents for evaluation.  Past surgical history is notable for his CABG surgery, tonsillectomy, and tear duct surgery.   Current Outpatient Medications  Medication Sig Dispense Refill   aspirin 81 MG tablet Take 81 mg by mouth daily.     Coenzyme Q10 (CO Q-10) 100 MG CAPS Take 100 mg by mouth daily.     ezetimibe (ZETIA) 10 MG tablet TAKE 1 TABLET BY MOUTH EVERY DAY 90 tablet 3   GLUCOSAMINE-CHONDROITIN PO Take 1 tablet by mouth 2 (two) times daily. 1500/$RemoveBeforeDEI'1103mg'iLhLwqmthXxbgnOC$  per patient     Multiple Vitamin (MULTIVITAMIN WITH MINERALS) TABS tablet Take 1 tablet by mouth daily.     rosuvastatin (CRESTOR) 40 MG tablet TAKE 1 TABLET BY MOUTH EVERY DAY 90 tablet 1   No current facility-administered medications for this visit.    Socially, he is married. He has 2 children.  He worked for [employer] with  [employer] worked a total of 89 years, retiring at age 50..  As a master's degree.  He did previously smoke cigarettes for 20 years, but quit in 1995.  He does drink wine.  He does exercise at minimum 3 days per week.  Family History  Problem Relation Age of Onset   Heart attack Maternal Grandfather    ROS General: Negative; No fevers, chills, or night sweats;  HEENT: Negative; No changes in vision or hearing, sinus congestion, difficulty swallowing Pulmonary: Negative; No cough, wheezing, shortness of breath, hemoptysis Cardiovascular: See history of present illness GI: Negative; No nausea , vomiting, diarrhea, or abdominal pain GU: He has had some issues with decreased libido. Musculoskeletal: Right hip and Achilles discomfort Hematologic/Oncology: Negative; no easy bruising,  bleeding Endocrine: Negative; no heat/cold intolerance; no diabetes Neuro: Negative; no changes in balance, headaches Skin: Negative; No rashes or skin lesions Psychiatric: Negative; No behavioral problems, depression Sleep: Negative; No snoring, daytime sleepiness, hypersomnolence, bruxism, restless legs, hypnogognic hallucinations, no cataplexy Other comprehensive 14 point system review is negative.  PE BP 122/78   Pulse (!) 57   Ht $R'5\' 8"'gi$  (1.727 m)   Wt 190 lb (86.2 kg)   SpO2 100%   BMI 28.89 kg/m    Repeat blood pressure by me was 128/76  Wt Readings from Last 3 Encounters:  08/21/22 190 lb (86.2 kg)  06/27/21 188 lb 9.6 oz (85.5 kg)  03/17/21 182 lb (82.6 kg)   General: Alert, oriented, no distress.  Skin: normal turgor, no rashes, warm and dry HEENT: Normocephalic, atraumatic. Pupils equal round and reactive to light; sclera anicteric; extraocular muscles intact;  Nose without nasal septal hypertrophy Mouth/Parynx benign; Mallinpatti scale 3 Neck: No JVD, no carotid bruits; normal carotid upstroke Lungs: clear to ausculatation and percussion; no wheezing or rales Chest wall: without tenderness to  palpitation Heart: PMI not displaced, RRR, s1 s2 normal, 1-2/6 early peaking systolic murmur, no diastolic murmur, no rubs, gallops, thrills, or heaves Abdomen: soft, nontender; no hepatosplenomehaly, BS+; abdominal aorta nontender and not dilated by palpation. Back: no CVA tenderness Pulses 2+ Musculoskeletal: full range of motion, normal strength, no joint deformities Extremities: no clubbing cyanosis or edema, Homan's sign negative  Neurologic: grossly nonfocal; Cranial nerves grossly wnl Psychologic: Normal mood and affect  August 21, 2022 ECG (independently read by me): Sinus bradycardia at 57,  RV conduction delay, early transition  June 27, 2021 ECG (independently read by me):  Sinus bradycardia at 51, RV conduction delay  July 28, 20212 ECG (independently read by me): Sinus rhythm at 63 bpm.  Mild RV conduction delay.  Normal intervals.  June 2019 ECG (independently read by me): This bradycardia 50 bpm.  First-degree AV block with a PR interval of 218 ms.  RV conduction delay.  February 25, 2017 ECG (independently read by me): Sinus bradycardia 53 bpm.  Mild RV conduction delay.  Borderline first-degree AV block with a PR interval 206.  We seconds.  April 2017 ECG (independently read by me): Sinus bradycardia at 49 bpm with first-degree AV block.  PR interval 212 ms.  QTc interval 433 ms.  Incomplete right bundle branch block.  March 2015 ECG (independently read by me): Sinus bradycardia 53 beats per minute.  No ectopy.  No significant change from his prior ECG from September 2014  LABS: Blood work at [facility] on 10/12/2015. BUN/creatinine 15/1.0.  Hemoglobin/hematocrit 14.8/44.5 Total cholesterol 145, triglycerides 65, HDL 40, LDL 92.  I reviewed most recent lab work from November 03, 2019 which showed total cholesterol 135, HDL 45, LDL 74, triglycerides 81. BUN 12, creatinine 1.0 TSH 1.3 PSA 1.9  BMET     Latest Ref Rng & Units 03/17/2021   10:12 PM  03/17/2021    9:44 PM 07/25/2018    8:22 AM  BMP  Glucose 70 - 99 mg/dL 120  120  86   BUN 8 - 23 mg/dL $Remove'19  17  20   'UuSCKkn$ Creatinine 0.61 - 1.24 mg/dL 1.10  1.09  1.15   BUN/Creat Ratio 10 - 24   17   Sodium 135 - 145 mmol/L 142  138  141   Potassium 3.5 - 5.1 mmol/L 4.1  4.1  4.7   Chloride 98 - 111 mmol/L 108  108  104   CO2 22 - 32 mmol/L  22  23   Calcium 8.9 - 10.3 mg/dL  9.3  9.0        Latest Ref Rng & Units 03/17/2021    9:44 PM 07/25/2018    8:22 AM 02/19/2017    8:57 AM  Hepatic Function  Total Protein 6.5 - 8.1 g/dL 7.0  6.5  6.7   Albumin 3.5 - 5.0 g/dL 3.9  4.1  3.9   AST 15 - 41 U/L 32  26  29   ALT 0 - 44 U/L $Remo'26  22  24   'ItDCX$ Alk Phosphatase 38 - 126 U/L 48  53  52   Total Bilirubin 0.3 - 1.2 mg/dL 0.7  0.7  0.8         Latest Ref Rng & Units 03/17/2021   10:12 PM 03/17/2021    9:44 PM 02/19/2017    8:57 AM  CBC  WBC 4.0 - 10.5 K/uL  13.5  5.6   Hemoglobin 13.0 - 17.0 g/dL 15.0  14.8  14.8   Hematocrit 39.0 - 52.0 % 44.0  44.6  44.3   Platelets 150 - 400 K/uL  179  169     Lab Results  Component Value Date   MCV 95.1 03/17/2021   MCV 95.9 02/19/2017   MCV 93.3 08/13/2013   Lab Results  Component Value Date   TSH 2.14 02/19/2017    BNP    Component Value Date/Time   PROBNP <30.0 05/01/2008 0420     Lipid Panel     Component Value Date/Time   CHOL 122 07/25/2018 0822   TRIG 59 07/25/2018 0822   HDL 49 07/25/2018 0822   CHOLHDL 2.5 07/25/2018 0822   CHOLHDL 3.0 02/19/2017 0857   VLDL 14 02/19/2017 0857   LDLCALC 61 07/25/2018 0822     RADIOLOGY: No results found.  IMPRESSION:  No diagnosis found.   ASSESSMENT AND PLAN: Carlos [name] is an 81 year old gentleman who suffered a NSTEMI  in June 2009 and underwent CABG surgery x5 by Dr. Cyndia Bent.  A nuclear perfusion study in October 2013 continued to show normal perfusion without scar or ischemia. A four-year follow-up study in April 2017 showed essentially normal perfusion, although minimal apical  lateral defect was present.  This most likely is artifact.  There was no reversible ischemia.  Ejection fraction was 55% with normal wall motion.  Over the last several years, he has remained stable.  He apparently had 2 separate COVID-19 infections with his initial episode in late February 2020 which she had symptoms for at least 2 weeks.  He subsequently was vaccinated this year but had a recent infection in early June 2021 after exposure from his 65-year-old grandson.  When I saw him in 2021 recommended he undergo follow-up echocardiographic evaluation.  I reviewed the echo findings with him in detail today.  This showed normal systolic function with an EF of 60 to 65% with mild LVH.  There was mild aortic stenosis without regurgitation.  There was moderate calcification of the valve.  Presently, his blood pressure is stable and a repeat by me was 122/70 supine and 116/70 standing.  With his bradycardia and heart rate at 50, I have suggested he wean and DC his low-dose nebivolol which currently is 2.5 mg daily.  I have suggested he reduce this to one half for the next 2 days, and then take one half every other day for 2 cycles then discontinuance.  He will continue to monitor his heart rate and blood pressure at home.  He continues to be on rosuvastatin 40 mg for hyperlipidemia in addition to Zetia with target LDL less than 70.  He is followed by Dr. Ardeth Perfect for his primary care who will be rechecking laboratory.  I will see him in 1 year for reevaluation or sooner as needed.   Troy Sine, MD, [facility] 08/21/2022 11:29 AM

## 2022-08-21 NOTE — Patient Instructions (Addendum)
Medication Instructions:  The current medical regimen is effective;  continue present plan and medications.  *If you need a refill on your cardiac medications before your next appointment, please call your pharmacy*  Testing/Procedures: Echocardiogram (August 2024) - Your physician has requested that you have an echocardiogram. Echocardiography is a painless test that uses sound waves to create images of your heart. It provides your doctor with information about the size and shape of your heart and how well your heart's chambers and valves are working. This procedure takes approximately one hour. There are no restrictions for this procedure.    Follow-Up: At [facility], you and your health needs are our priority.  As part of our continuing mission to provide you with exceptional heart care, we have created designated Provider Care Teams.  These Care Teams include your primary Cardiologist (physician) and Advanced Practice Providers (APPs -  Physician Assistants and Nurse Practitioners) who all work together to provide you with the care you need, when you need it.  We recommend signing up for the patient portal called "MyChart".  Sign up information is provided on this After Visit Summary.  MyChart is used to connect with patients for Virtual Visits (Telemedicine).  Patients are able to view lab/test results, encounter notes, upcoming appointments, etc.  Non-urgent messages can be sent to your provider as well.   To learn more about what you can do with MyChart, go to [email or web address].    Your next appointment:   12 month(s)  The format for your next appointment:   In Person  Provider:   Shelva Majestic, MD

## 2022-08-23 ENCOUNTER — Encounter: Payer: Self-pay | Admitting: Cardiovascular Disease

## 2022-12-06 ENCOUNTER — Other Ambulatory Visit: Payer: Self-pay | Admitting: Cardiovascular Disease

## 2023-02-12 LAB — LAB REPORT - SCANNED: EGFR: 71.4

## 2023-03-02 ENCOUNTER — Other Ambulatory Visit: Payer: Self-pay | Admitting: Cardiovascular Disease

## 2023-06-10 ENCOUNTER — Other Ambulatory Visit: Payer: Self-pay | Admitting: Cardiovascular Disease

## 2023-07-02 ENCOUNTER — Ambulatory Visit (HOSPITAL_COMMUNITY): Payer: Medicare Other | Attending: Cardiology

## 2023-07-02 DIAGNOSIS — I10 Essential (primary) hypertension: Secondary | ICD-10-CM

## 2023-07-02 LAB — ECHOCARDIOGRAM COMPLETE
AR max vel: 1.93 cm2
AV Area VTI: 2 cm2
AV Area mean vel: 1.76 cm2
AV Mean grad: 14 mmHg
AV Peak grad: 24.6 mmHg
Ao pk vel: 2.48 m/s
Area-P 1/2: 2.39 cm2
S' Lateral: 3.4 cm

## 2023-10-03 ENCOUNTER — Ambulatory Visit: Payer: Medicare Other | Attending: Cardiovascular Disease | Admitting: Cardiovascular Disease

## 2023-10-03 ENCOUNTER — Encounter: Payer: Self-pay | Admitting: Cardiovascular Disease

## 2023-10-03 VITALS — BP 126/88 | HR 58 | Ht 68.0 in | Wt 189.2 lb

## 2023-10-03 DIAGNOSIS — Z951 Presence of aortocoronary bypass graft: Secondary | ICD-10-CM

## 2023-10-03 DIAGNOSIS — I350 Nonrheumatic aortic (valve) stenosis: Secondary | ICD-10-CM | POA: Diagnosis not present

## 2023-10-03 DIAGNOSIS — I3481 Nonrheumatic mitral (valve) annulus calcification: Secondary | ICD-10-CM

## 2023-10-03 DIAGNOSIS — E785 Hyperlipidemia, unspecified: Secondary | ICD-10-CM

## 2023-10-03 DIAGNOSIS — I2510 Atherosclerotic heart disease of native coronary artery without angina pectoris: Secondary | ICD-10-CM

## 2023-10-03 NOTE — Progress Notes (Signed)
Patient ID: Carlos [name], male   DOB: [birth year], 82 y.o.   MRN: [id number]       Primary M.D.: [place] [name]  HPI: Carlos [name] is a 82 year old gentleman was born in [name], [place], and worked for [employer].  He is a former patient of Carlos [name].  [name] in October 2023.  He presents for a 13 month follow-up evaluation.   Carlos [name] has has CAD and in June 2009 underwent CABG surgery x5 by [name] after presenting with a non-ST segment elevation myocardial infarction. A LIMA was placed to the LAD, a  SVG to the second diagonal branch of the LAD, sequential vein graft to the first and second obtuse marginal branches of the circumflex coronary artery, and vein graft to the PDA branch of the RCA with endoscopic vein harvesting from both thighs.  He is not undergone cardiac catheterizations since that time.  His last nuclear perfusion study in October 2013 did not show any ischemia. In October 2014, he underwent carotid duplex imaging, which showed mild plaque without significant stenoses, bilaterally.  Laboratory in September 2014 showed a TC 132, triglycerides 72, LDL 74, HDL 44 on  Crestor 40 mg in addition to Niaspan 500 mg.  There was no evidence for anemia.  Glucose was minimally increased at 153, TSH was normal as was his PSA.  An echo Doppler study in October 2014 showed an ejection fraction of 60-65% with normal wall motion.  He retired from [employer] in November 2016 and continues to be active.  A nuclear stress test in 02/22/2016 was low risk and had mild attenuation artifact in the apical lateral segment.  There was no ischemia.  Ejection fraction was 55% with normal wall motion.    Laboratory from February 19, 2017 was normal.  Lipid studies were excellent with cholesterol 132, triglycerides 71, HDL 44, VLDL 14, and LDL 74.  When saw him in June 2019 he denied any episodes of chest pain.  He continued to be active and was walking 2 to 3 miles a day and exercises in a gym at  least 3 days/week both doing cardiac and resistance training.  He exercises at the [facility].  Had undergone laboratory in December 2018 which showed a total cholesterol 155, HDL 43, LDL 94, triglycerides 89.  He was on  rosuvastatin 40 mg in addition to nebivolol 2.5 mg and aspirin 81 mg.  He denies any chest pain, palpitations, PND orthopnea, presyncope or syncope.  During that evaluation I added Zetia 10 mg with a goal to achieve an LDL less than 70.  Since [name], he was evaluated in a telemedicine visit with Carlos [name] in July 2020.  Since his last evaluation with me, he states that he believes he had 2 separate Covid infections.  He believed in February 2020 he developed Covid symptomatology which lasted for almost 2 to 4 weeks.  This year he had been vaccinated and again in June 2021 he experienced several days of recurrent symptomatology and his wife also experienced more prolonged symptoms following exposure from their grandchild.  He had undergone laboratory by [name] in December 2015 which showed an LDL cholesterol at 74.  Creatinine was 1.0.  BUN 12.  I saw him in July 2021.  At that time he had not been as active as he had in the past secondary to issues with his hip as well as his right Achilles heel.  However he has  undergone rehab and now is resuming increased activity.  He deniesd any recurrent anginal symptoms.  He denied any recent fever chills or night sweats but even with his most recent infection in June he did experience fever and cough.  He was unaware of palpitations.  He deniesdany exertional dyspnea presyncope or syncope.  During that evaluation, I recommended he undergo a follow-up echo Doppler study since his last echo was in 2014 and he had 2 previous COVID infections.  His echo Doppler study was done on July 20, 2020 which showed normal LV function with EF 60 to 65%.  There was mild aortic valve stenosis with moderate calcification and thickening.  There was  no aortic insufficiency.  In April 2022 he was walking on a trail at a brisk pace and apparently tripped.  He landed hard to the ground and sustained injury to his right arm forearm with significant bruising.  When he went home walking 1 more mile after he had fallen, he began to have significant swelling and pain and may have experienced a vagal episode leading to syncope.  He presented to [facility].  He was hypotensive and bradycardic upon arrival which responded to intravenous fluids.  He had a series of CT scans and x-rays which showed soft tissue swelling without broken bones.  On his head CT there was generalized cerebral atrophy.  There was right supraorbital and right frontal scalp soft tissue swelling without acute osseous abnormality.  His abdominal CT showed mild bilateral lower lobe linear scarring and/or atelectasis.  There was sigmoid diverticulosis.  His prostate gland was enlarged and there was evidence for aortic atherosclerosis.  I saw him on June 27, 2021 at which time he felt well and denied any episodes of chest pain or shortness of breath. His pulse typically runs in the 50s.  He denied any further presyncope or syncope.  With his bradycardia and pulse at 50 I suggested he wean and DC his low-dose nebivolol over a week and that he continue to monitor heart rate and blood pressure at home.  When [name] on August 21, 2022 he was continuing to do well.  His blood pressure has been stable.  He is no longer on low-dose nebivolol.  He continues to be on rosuvastatin 40 mg and Zetia 10 mg for aggressive lipid-lowering therapy.  Lipid study on January 23, 2022 showed total cholesterol 119, LDL 69 triglycerides 87.  HDL was low at 33.  Potassium was 4.4.  He underwent an echo Doppler study in June 27, 2022.  EF was 55%.  He had grade 1 diastolic dysfunction.  He had normal pulmonary artery systolic pressure estimated at 23.8 mmHg.  He had severe aortic valve calcification and had moderate  aortic stenosis with a mean gradient of 17 and peak gradient of 27.5.  There was moderate mitral annular calcification.  He remains asymptomatic.  He denies chest pain PND orthopnea, exertional dyspnea, presyncope or syncope.  He had recently sprained his right knee which had limited some walking but he is now starting to resume activity.    Since [name], he has seen Carlos [name] for primary care and had laboratory in March 2024.  Chemistry was normal.  TSH was 2.24.  CBC was stable.  Total cholesterol was 132, triglycerides 90, HDL 40, LDL 74.  Presently he continues to be stable with without chest pain or shortness of breath.  He is walking 2-3 times per week for up to  2 miles each time.  There was completed an echo Doppler study on July 02, 2023 which continued to show normal LV function with EF 60 to 65%, grade 1 diastolic dysfunction.  There is mild to moderate aortic valve stenosis with a mean gradient of 14 mm and peak gradient of 24.6 mm.  He presents for evaluation.    Past surgical history is notable for his CABG surgery, tonsillectomy, and tear duct surgery.  Current Outpatient Medications  Medication Sig Dispense Refill   aspirin 81 MG tablet Take 81 mg by mouth daily.     Coenzyme Q10 (CO Q-10) 100 MG CAPS Take 100 mg by mouth daily.     ezetimibe (ZETIA) 10 MG tablet TAKE 1 TABLET BY MOUTH EVERY DAY 90 tablet 3   GLUCOSAMINE-CHONDROITIN PO Take 1 tablet by mouth 2 (two) times daily. 1500/1103mg  per patient     Multiple Vitamin (MULTIVITAMIN WITH MINERALS) TABS tablet Take 1 tablet by mouth daily.     rosuvastatin (CRESTOR) 40 MG tablet TAKE 1 TABLET BY MOUTH EVERY DAY 90 tablet 1   No current facility-administered medications for this visit.    Socially, he is married. He has 2 children.  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  As a master's degree.  He did previously smoke cigarettes for 20 years, but quit in 1995.  He does  drink wine.  He does exercise at minimum 3 days per week.  Family History  Problem Relation Age of Onset   Heart attack Maternal Grandfather    ROS General: Negative; No fevers, chills, or night sweats;  HEENT: Negative; No changes in vision or hearing, sinus congestion, difficulty swallowing Pulmonary: Negative; No cough, wheezing, shortness of breath, hemoptysis Cardiovascular: See history of present illness GI: Negative; No nausea , vomiting, diarrhea, or abdominal pain GU: He has had some issues with decreased libido. Musculoskeletal: Right hip and Achilles discomfort Hematologic/Oncology: Negative; no easy bruising, bleeding Endocrine: Negative; no heat/cold intolerance; no diabetes Neuro: Negative; no changes in balance, headaches Skin: Negative; No rashes or skin lesions Psychiatric: Negative; No behavioral problems, depression Sleep: Negative; No snoring, daytime sleepiness, hypersomnolence, bruxism, restless legs, hypnogognic hallucinations, no cataplexy Other comprehensive 14 point system review is negative.  PE BP 126/88   Pulse (!) 58   Ht 5\' 8"  (1.727 m)   Wt 189 lb 3.2 oz (85.8 kg)   SpO2 98%   BMI 28.77 kg/m    Repeat blood pressure by me was 120/70  Wt Readings from Last 3 Encounters:  10/03/23 189 lb 3.2 oz (85.8 kg)  08/21/22 190 lb (86.2 kg)  06/27/21 188 lb 9.6 oz (85.5 kg)   General: Alert, oriented, no distress.  Skin: normal turgor, no rashes, warm and dry HEENT: Normocephalic, atraumatic. Pupils equal round and reactive to light; sclera anicteric; extraocular muscles intact;  Nose without nasal septal hypertrophy Mouth/Parynx benign; Mallinpatti scale 3 Neck: No JVD, no carotid bruits; normal carotid upstroke Lungs: clear to ausculatation and percussion; no wheezing or rales Chest wall: without tenderness to palpitation Heart: PMI not displaced, RRR, s1 s2 normal, 2/6 systolic murmur in aortic area, no diastolic murmur, no rubs, gallops, thrills,  or heaves Abdomen: soft, nontender; no hepatosplenomehaly, BS+; abdominal aorta nontender and not dilated by palpation. Back: no CVA tenderness Pulses 2+ Musculoskeletal: full range of motion, normal strength, no joint deformities Extremities: no clubbing cyanosis or edema, Homan's sign negative  Neurologic: grossly nonfocal; Cranial  nerves grossly wnl Psychologic: Normal mood and affect   EKG Interpretation Date/Time:  Thursday October 03 2023 10:25:41 EST Ventricular Rate:  58 PR Interval:  210 QRS Duration:  96 QT Interval:  458 QTC Calculation: 449 R Axis:   0  Text Interpretation: Sinus bradycardia with 1st degree A-V block RSR' or QR pattern in V1 suggests right ventricular conduction delay Inferior infarct , age undetermined When compared with ECG of 17-Mar-2021 21:39, PREVIOUS ECG IS PRESENT Confirmed by Nicki Guadalajara ([id number]) on 10/03/2023 11:37:03 AM      August 21, 2022 ECG (independently read by me): Sinus bradycardia at 57,  RV conduction delay, early transition  June 27, 2021 ECG (independently read by me):  Sinus bradycardia at 51, RV conduction delay  July 28, 20212 ECG (independently read by me): Sinus rhythm at 63 bpm.  Mild RV conduction delay.  Normal intervals.  June 2019 ECG (independently read by me): This bradycardia 50 bpm.  First-degree AV block with a PR interval of 218 ms.  RV conduction delay.  February 25, 2017 ECG (independently read by me): Sinus bradycardia 53 bpm.  Mild RV conduction delay.  Borderline first-degree AV block with a PR interval 206.  We seconds.  April 2017 ECG (independently read by me): Sinus bradycardia at 49 bpm with first-degree AV block.  PR interval 212 ms.  QTc interval 433 ms.  Incomplete right bundle branch block.  March 2015 ECG (independently read by me): Sinus bradycardia 53 beats per minute.  No ectopy.  No significant change from his prior ECG from September 2014  LABS: Blood work at [facility] on  10/12/2015. BUN/creatinine 15/1.0.  Hemoglobin/hematocrit 14.8/44.5 Total cholesterol 145, triglycerides 65, HDL 40, LDL 92.  I reviewed most recent lab work from November 03, 2019 which showed total cholesterol 135, HDL 45, LDL 74, triglycerides 81. BUN 12, creatinine 1.0 TSH 1.3 PSA 1.9  BMET     Latest Ref Rng & Units 03/17/2021   10:12 PM 03/17/2021    9:44 PM 07/25/2018    8:22 AM  BMP  Glucose 70 - 99 mg/dL 604  540  86   BUN 8 - 23 mg/dL 19  17  20    Creatinine 0.61 - 1.24 mg/dL 9.81  1.91  4.78   BUN/Creat Ratio 10 - 24   17   Sodium 135 - 145 mmol/L 142  138  141   Potassium 3.5 - 5.1 mmol/L 4.1  4.1  4.7   Chloride 98 - 111 mmol/L 108  108  104   CO2 22 - 32 mmol/L  22  23   Calcium 8.9 - 10.3 mg/dL  9.3  9.0        Latest Ref Rng & Units 03/17/2021    9:44 PM 07/25/2018    8:22 AM 02/19/2017    8:57 AM  Hepatic Function  Total Protein 6.5 - 8.1 g/dL 7.0  6.5  6.7   Albumin 3.5 - 5.0 g/dL 3.9  4.1  3.9   AST 15 - 41 U/L 32  26  29   ALT 0 - 44 U/L 26  22  24    Alk Phosphatase 38 - 126 U/L 48  53  52   Total Bilirubin 0.3 - 1.2 mg/dL 0.7  0.7  0.8         Latest Ref Rng & Units 03/17/2021   10:12 PM 03/17/2021    9:44 PM 02/19/2017    8:57 AM  CBC  WBC  4.0 - 10.5 K/uL  13.5  5.6   Hemoglobin 13.0 - 17.0 g/dL 08.6  57.8  46.9   Hematocrit 39.0 - 52.0 % 44.0  44.6  44.3   Platelets 150 - 400 K/uL  179  169     Lab Results  Component Value Date   MCV 95.1 03/17/2021   MCV 95.9 02/19/2017   MCV 93.3 08/13/2013   Lab Results  Component Value Date   TSH 2.14 02/19/2017    BNP    Component Value Date/Time   PROBNP <30.0 05/01/2008 0420     Lipid Panel     Component Value Date/Time   CHOL 122 07/25/2018 0822   TRIG 59 07/25/2018 0822   HDL 49 07/25/2018 0822   CHOLHDL 2.5 07/25/2018 0822   CHOLHDL 3.0 02/19/2017 0857   VLDL 14 02/19/2017 0857   LDLCALC 61 07/25/2018 0822     RADIOLOGY: No results found.  ECHO: 06/27/2022  1. Left ventricular  ejection fraction, by estimation, is 55%. The left  ventricle has normal function. The left ventricle has no regional wall  motion abnormalities. There is mild left ventricular hypertrophy. Left  ventricular diastolic parameters are  consistent with Grade I diastolic dysfunction (impaired relaxation).   2. Right ventricular systolic function is mildly reduced. The right  ventricular size is normal. There is normal pulmonary artery systolic  pressure. The estimated right ventricular systolic pressure is 23.8 mmHg.   3. The mitral valve is normal in structure. Trivial mitral valve  regurgitation. No evidence of mitral stenosis. Moderate mitral annular  calcification.   4. The aortic valve is tricuspid. There is severe calcifcation of the  aortic valve. Aortic valve regurgitation is not visualized. Moderate  paradoxical low flow/low gradient aortic valve stenosis. Aortic valve  area, by VTI measures 1.02 cm. Aortic valve   mean gradient measures 17.0 mmHg.   5. The inferior vena cava is normal in size with greater than 50%  respiratory variability, suggesting right atrial pressure of 3 mmHg.     ECHO: 07/02/2023  1. Left ventricular ejection fraction, by estimation, is 60 to 65%. The  left ventricle has normal function. The left ventricle has no regional  wall motion abnormalities. There is mild left ventricular hypertrophy.  Left ventricular diastolic parameters  are consistent with Grade I diastolic dysfunction (impaired relaxation).  The average left ventricular global longitudinal strain is -20.3 %. The  global longitudinal strain is normal.   2. Right ventricular systolic function is normal. The right ventricular  size is normal.   3. The mitral valve is normal in structure. No evidence of mitral valve  regurgitation. No evidence of mitral stenosis. Moderate mitral annular  calcification.   4. The aortic valve is tricuspid. There is moderate calcification of the  aortic valve.  There is moderate thickening of the aortic valve. Aortic  valve regurgitation is not visualized. Mild to moderate aortic valve  stenosis. Aortic valve mean gradient  measures 14.0 mmHg. Aortic valve Vmax measures 2.48 m/s.   5. The inferior vena cava is normal in size with greater than 50%  respiratory variability, suggesting right atrial pressure of 3 mmHg.   IMPRESSION:  1. Coronary artery disease involving native coronary artery of native heart without angina pectoris   2. Hx of CABG   3. Hyperlipidemia with target LDL less than 70   4. Nonrheumatic aortic valve stenosis   5. Moderate mitral annular calcification     ASSESSMENT AND PLAN: Mr. Ashleigh is  a young appearing 53 -year-old-year-old gentleman who suffered a NSTEMI  in June 2009 and underwent CABG surgery x5 by Dr. Laneta Simmers.  A nuclear perfusion study in October 2013 continued to show normal perfusion without scar or ischemia. A four-year follow-up study in April 2017 showed essentially normal perfusion, although minimal apical lateral defect was present which most likely was artifact.  .  There was no reversible ischemia.  Ejection fraction was 55% with normal wall motion.  Over the last several years, he has remained stable.  He apparently had 2 separate COVID-19 infections with his initial episode in late February 2020 which she had symptoms for at least 2 weeks.  He subsequently was vaccinated but had an infection in early June 2021 after exposure from his 11-year-old grandson.  When I saw him in 2021 recommended he undergo follow-up echocardiographic evaluation which showed normal systolic function with an EF of 60 to 65% with mild LVH.  There was mild aortic stenosis without regurgitation.  There was moderate calcification of the valve.  When seen by me in August 2022 he was bradycardic and it was recommended that he wean and discontinue his low-dose nebivolol.  At follow-up evaluation in October 2023 his blood pressure was stable.  His  resting pulse is 57.  He is not on any medication for blood pressure control or heart rate.  He continues to be on Zetia and rosuvastatin 40 mg for hyperlipidemia.  LDL cholesterol was 69 in March 2023.  His echo Doppler study from June 27, 2022 showed normal LV function with EF 55%,grade 1 diastolic dysfunction, moderate mitral annular calcification and significant calcification of his aortic valve.  There is moderate aortic stenosis with a mean gradient of 17, peak gradient of 27.5.  Aortic valve estimate was 1.02 cm.  He remained stable.  Presently, Mr. Bryken continues to be asymptomatic and denies chest pain, shortness of breath, presyncope or syncope.  He is walking 2 to 3 days/week for at least 2 miles.  I reviewed his most recent echo Doppler study from July 02, 2023 which remained stable.  LV function is normal with EF 60 to 64% and he has grade 1 diastolic dysfunction.  Aortic valve mean gradient and peak gradient are 14.0 and 24.6 mmHg, respectively.  He continues to be on Zetia 10 mg and rosuvastatin 40 mg for hyperlipidemia.  Most recent laboratory by Dr. Silvio Clayman was reviewed from February 12, 2023 where LDL was 74.  He will continue current therapy as prescribed.  I have recommended that in September 2025 he undergo a repeat echocardiographic assessment.  I discussed with him my plans for retirement.  With his aortic valve stenosis and history of prior CABG I will transition him to see Dr. Excell Seltzer in October 2025.   Lennette Bihari, MD, [facility] 10/14/2023 10:53 AM

## 2023-10-03 NOTE — Patient Instructions (Addendum)
Medication Instructions:  NO MEDICATION CHANGES *If you need a refill on your cardiac medications before your next appointment, please call your pharmacy*   Lab Work: NO LABS ORDERED If you have labs (blood work) drawn today and your tests are completely normal, you will receive your results only by: MyChart Message (if you have MyChart) OR A paper copy in the mail If you have any lab test that is abnormal or we need to change your treatment, we will call you to review the results.   Testing/Procedures: Your physician has requested that you have an echocardiogram IN ONE YEAR. Echocardiography is a painless test that uses sound waves to create images of your heart. It provides your doctor with information about the size and shape of your heart and how well your heart's chambers and valves are working. This procedure takes approximately one hour. There are no restrictions for this procedure. Please do NOT wear cologne, perfume, aftershave, or lotions (deodorant is allowed). Please arrive 15 minutes prior to your appointment time.  Please note: We ask at that you not bring children with you during ultrasound (echo/ vascular) testing. Due to room size and safety concerns, children are not allowed in the ultrasound rooms during exams. Our front office staff cannot provide observation of children in our lobby area while testing is being conducted. An adult accompanying a patient to their appointment will only be allowed in the ultrasound room at the discretion of the ultrasound technician under special circumstances. We apologize for any inconvenience.    Follow-Up: At [facility], you and your health needs are our priority.  As part of our continuing mission to provide you with exceptional heart care, we have created designated Provider Care Teams.  These Care Teams include your primary Cardiologist (physician) and Advanced Practice Providers (APPs -  Physician Assistants and Nurse  Practitioners) who all work together to provide you with the care you need, when you need it.  We recommend signing up for the patient portal called "MyChart".  Sign up information is provided on this After Visit Summary.  MyChart is used to connect with patients for Virtual Visits (Telemedicine).  Patients are able to view lab/test results, encounter notes, upcoming appointments, etc.  Non-urgent messages can be sent to your provider as well.   To learn more about what you can do with MyChart, go to [email or web address].    Your next appointment:   1 year(s)  Provider:   Tonny Bollman, MD

## 2023-10-14 ENCOUNTER — Encounter: Payer: Self-pay | Admitting: Cardiovascular Disease

## 2023-12-03 ENCOUNTER — Other Ambulatory Visit: Payer: Self-pay | Admitting: Cardiovascular Disease

## 2024-02-12 ENCOUNTER — Encounter (HOSPITAL_COMMUNITY): Payer: Self-pay

## 2024-02-12 ENCOUNTER — Emergency Department (HOSPITAL_COMMUNITY)

## 2024-02-12 ENCOUNTER — Observation Stay (HOSPITAL_COMMUNITY)

## 2024-02-12 ENCOUNTER — Inpatient Hospital Stay (HOSPITAL_COMMUNITY)
Admission: EM | Admit: 2024-02-12 | Discharge: 2024-02-14 | DRG: 312 | Disposition: A | Discharge status: Home / Self Care | Attending: Internal Medicine | Admitting: Internal Medicine

## 2024-02-12 ENCOUNTER — Other Ambulatory Visit: Payer: Self-pay

## 2024-02-12 DIAGNOSIS — Z7982 Long term (current) use of aspirin: Secondary | ICD-10-CM

## 2024-02-12 DIAGNOSIS — I2510 Atherosclerotic heart disease of native coronary artery without angina pectoris: Secondary | ICD-10-CM | POA: Diagnosis present

## 2024-02-12 DIAGNOSIS — R55 Syncope and collapse: Principal | ICD-10-CM | POA: Diagnosis present

## 2024-02-12 DIAGNOSIS — E785 Hyperlipidemia, unspecified: Secondary | ICD-10-CM | POA: Diagnosis present

## 2024-02-12 DIAGNOSIS — I442 Atrioventricular block, complete: Secondary | ICD-10-CM | POA: Diagnosis present

## 2024-02-12 DIAGNOSIS — I350 Nonrheumatic aortic (valve) stenosis: Secondary | ICD-10-CM | POA: Diagnosis not present

## 2024-02-12 DIAGNOSIS — E876 Hypokalemia: Secondary | ICD-10-CM | POA: Diagnosis present

## 2024-02-12 DIAGNOSIS — I4589 Other specified conduction disorders: Secondary | ICD-10-CM | POA: Diagnosis present

## 2024-02-12 DIAGNOSIS — Z8249 Family history of ischemic heart disease and other diseases of the circulatory system: Secondary | ICD-10-CM

## 2024-02-12 DIAGNOSIS — W19XXXA Unspecified fall, initial encounter: Secondary | ICD-10-CM | POA: Diagnosis present

## 2024-02-12 DIAGNOSIS — N179 Acute kidney failure, unspecified: Secondary | ICD-10-CM | POA: Diagnosis present

## 2024-02-12 DIAGNOSIS — I951 Orthostatic hypotension: Principal | ICD-10-CM | POA: Diagnosis present

## 2024-02-12 DIAGNOSIS — Z79899 Other long term (current) drug therapy: Secondary | ICD-10-CM

## 2024-02-12 DIAGNOSIS — S0081XA Abrasion of other part of head, initial encounter: Secondary | ICD-10-CM | POA: Diagnosis present

## 2024-02-12 DIAGNOSIS — F17210 Nicotine dependence, cigarettes, uncomplicated: Secondary | ICD-10-CM | POA: Diagnosis present

## 2024-02-12 DIAGNOSIS — K30 Functional dyspepsia: Secondary | ICD-10-CM | POA: Diagnosis present

## 2024-02-12 DIAGNOSIS — Z887 Allergy status to serum and vaccine status: Secondary | ICD-10-CM

## 2024-02-12 DIAGNOSIS — Z951 Presence of aortocoronary bypass graft: Secondary | ICD-10-CM

## 2024-02-12 DIAGNOSIS — R197 Diarrhea, unspecified: Secondary | ICD-10-CM | POA: Diagnosis present

## 2024-02-12 DIAGNOSIS — Z888 Allergy status to other drugs, medicaments and biological substances status: Secondary | ICD-10-CM

## 2024-02-12 DIAGNOSIS — E869 Volume depletion, unspecified: Secondary | ICD-10-CM | POA: Diagnosis present

## 2024-02-12 DIAGNOSIS — R001 Bradycardia, unspecified: Secondary | ICD-10-CM | POA: Diagnosis present

## 2024-02-12 LAB — CBC WITH DIFFERENTIAL/PLATELET
Abs Immature Granulocytes: 0.02 10*3/uL (ref 0.00–0.07)
Basophils Absolute: 0 10*3/uL (ref 0.0–0.1)
Basophils Relative: 0 %
Eosinophils Absolute: 0 10*3/uL (ref 0.0–0.5)
Eosinophils Relative: 0 %
HCT: 44 % (ref 39.0–52.0)
Hemoglobin: 14.8 g/dL (ref 13.0–17.0)
Immature Granulocytes: 0 %
Lymphocytes Relative: 3 %
Lymphs Abs: 0.3 10*3/uL — ABNORMAL LOW (ref 0.7–4.0)
MCH: 31.6 pg (ref 26.0–34.0)
MCHC: 33.6 g/dL (ref 30.0–36.0)
MCV: 94 fL (ref 80.0–100.0)
Monocytes Absolute: 0.5 10*3/uL (ref 0.1–1.0)
Monocytes Relative: 5 %
Neutro Abs: 9 10*3/uL — ABNORMAL HIGH (ref 1.7–7.7)
Neutrophils Relative %: 92 %
Platelets: 175 10*3/uL (ref 150–400)
RBC: 4.68 MIL/uL (ref 4.22–5.81)
RDW: 12.5 % (ref 11.5–15.5)
WBC: 9.8 10*3/uL (ref 4.0–10.5)
nRBC: 0 % (ref 0.0–0.2)

## 2024-02-12 LAB — BASIC METABOLIC PANEL
Anion gap: 10 (ref 5–15)
BUN: 23 mg/dL (ref 8–23)
CO2: 22 mmol/L (ref 22–32)
Calcium: 9 mg/dL (ref 8.9–10.3)
Chloride: 108 mmol/L (ref 98–111)
Creatinine, Ser: 1.26 mg/dL — ABNORMAL HIGH (ref 0.61–1.24)
GFR, Estimated: 56 mL/min — ABNORMAL LOW (ref >60)
Glucose, Bld: 135 mg/dL — ABNORMAL HIGH (ref 70–99)
Potassium: 3.9 mmol/L (ref 3.5–5.1)
Sodium: 140 mmol/L (ref 135–145)

## 2024-02-12 LAB — HEPATIC FUNCTION PANEL
ALT: 25 U/L (ref 0–44)
AST: 40 U/L (ref 15–41)
Albumin: 3.8 g/dL (ref 3.5–5.0)
Alkaline Phosphatase: 40 U/L (ref 38–126)
Bilirubin, Direct: 0.2 mg/dL (ref 0.0–0.2)
Indirect Bilirubin: 0.9 mg/dL (ref 0.3–0.9)
Total Bilirubin: 1.1 mg/dL (ref 0.0–1.2)
Total Protein: 7.2 g/dL (ref 6.5–8.1)

## 2024-02-12 LAB — LIPASE, BLOOD: Lipase: 27 U/L (ref 11–51)

## 2024-02-12 LAB — ETHANOL: Alcohol, Ethyl (B): <10 mg/dL (ref <10)

## 2024-02-12 LAB — TROPONIN I (HIGH SENSITIVITY)
Troponin I (High Sensitivity): 29 ng/L — ABNORMAL HIGH (ref <18)
Troponin I (High Sensitivity): 38 ng/L — ABNORMAL HIGH (ref <18)

## 2024-02-12 LAB — CBG MONITORING, ED: Glucose-Capillary: 112 mg/dL — ABNORMAL HIGH (ref 70–99)

## 2024-02-12 LAB — MAGNESIUM: Magnesium: 1.7 mg/dL (ref 1.7–2.4)

## 2024-02-12 MED ORDER — SODIUM CHLORIDE 0.9% FLUSH
3.0000 mL | Freq: Two times a day (BID) | INTRAVENOUS | Status: DC
  Administered 2024-02-12 – 2024-02-14 (×4): 3 mL via INTRAVENOUS

## 2024-02-12 MED ORDER — ASPIRIN 81 MG PO CHEW
81.0000 mg | CHEWABLE_TABLET | Freq: Every day | ORAL | Status: DC
  Administered 2024-02-14: 81 mg via ORAL
  Filled 2024-02-12 (×2): qty 1

## 2024-02-12 MED ORDER — ENOXAPARIN SODIUM 40 MG/0.4ML IJ SOSY
40.0000 mg | PREFILLED_SYRINGE | INTRAMUSCULAR | Status: DC
  Administered 2024-02-12 – 2024-02-13 (×2): 40 mg via SUBCUTANEOUS
  Filled 2024-02-12 (×2): qty 0.4

## 2024-02-12 MED ORDER — ROSUVASTATIN CALCIUM 20 MG PO TABS
40.0000 mg | ORAL_TABLET | Freq: Every day | ORAL | Status: DC
  Administered 2024-02-14: 40 mg via ORAL
  Filled 2024-02-12 (×2): qty 2

## 2024-02-12 MED ORDER — SODIUM CHLORIDE 0.9 % IV SOLN
INTRAVENOUS | Status: DC
  Administered 2024-02-13: 125 mL via INTRAVENOUS

## 2024-02-12 MED ORDER — EZETIMIBE 10 MG PO TABS
10.0000 mg | ORAL_TABLET | Freq: Every day | ORAL | Status: DC
  Administered 2024-02-14: 10 mg via ORAL
  Filled 2024-02-12 (×2): qty 1

## 2024-02-12 MED ORDER — POLYETHYLENE GLYCOL 3350 17 G PO PACK: 17.0000 g | PACK | Freq: Every day | ORAL | Status: DC | PRN

## 2024-02-12 MED ORDER — ACETAMINOPHEN 650 MG RE SUPP: 650.0000 mg | Freq: Four times a day (QID) | RECTAL | Status: DC | PRN

## 2024-02-12 MED ORDER — ACETAMINOPHEN 325 MG PO TABS: 650.0000 mg | ORAL_TABLET | Freq: Four times a day (QID) | ORAL | Status: DC | PRN

## 2024-02-12 NOTE — ED Triage Notes (Signed)
 Pt fell last night around 02:00. Pt had some stomach discomfort and went to the restroom to drink water. While walking to toilet he passed out. Pt unsure how long he was out but noticed he hit his head on the tile floor. Pt takes 81 mg aspirin.  Pt states head was bleeding but it's controlled at this time.

## 2024-02-12 NOTE — Plan of Care (Signed)
  Problem: Activity: Goal: Risk for activity intolerance will decrease Outcome: Progressing   Problem: Nutrition: Goal: Adequate nutrition will be maintained Outcome: Progressing   Problem: Coping: Goal: Level of anxiety will decrease Outcome: Progressing   Problem: Pain Managment: Goal: General experience of comfort will improve and/or be controlled Outcome: Progressing   Problem: Safety: Goal: Ability to remain free from injury will improve Outcome: Progressing

## 2024-02-12 NOTE — H&P (Addendum)
 History and Physical   Carlos [name] UXL:[id number] DOB: 03/27/1941 DOA: 02/12/2024  PCP: Alysia Penna, MD   Patient coming from: Home  Chief Complaint: Syncope  HPI: Carlos [name] is a 83 y.o. male with medical history significant of bradycardia, aortic stenosis, hyperlipidemia, CAD, status post CABG x 5 presenting after episode of syncope.  Patient was at home and was getting up to go to the bathroom this morning.  He states that shortly after he got up while he was heading to the bathroom he lost consciousness.  He states he did hit his head and has an abrasion.  He is not sure how long he was unconscious but thinks he was down on the ground for a total of 15 to 30 minutes with some of that time being conscious.  While there was no prodrome prior to the event patient states that afterwards when he was having a bowel movement he noticed he was lightheaded and then when he went back and went to the bathroom again later in the day prior to coming to the ED he felt lightheaded during a bowel movement.  Wife reportedly found him on the ground and checked his pulse and felt like it was slow and abnormal.  EMS was called and evaluated patient on their arrival. He declined EMS transport.  Patient later spoke with his family and decided to come and be evaluated in the ED.  Denies fevers, chills, chest pain, shortness of breath, abdominal pain, constipation, diarrhea, nausea, vomiting.  ED Course: Vital signs in the ED stable.  Lab workup included CMP with creatinine stable 1.26, glucose 135.  CBC within normal limits.  Troponin 29 with repeat pending.  Lipase normal.  Magnesium normal.  Chest x-ray without acute abnormality.  Patient started on IV fluids in the ED.  Ethanol level negative.  Review of Systems: As per HPI otherwise all other systems reviewed and are negative.  Past Medical History:  Diagnosis Date   Coronary artery disease 2009   LIMA-LAD, SVG-D1, SVG-OM1-0M2, SVG-PDA     Past Surgical History:  Procedure Laterality Date   Carotid Duplex  05/04/2008   bilateral high bifurcations, no obvious plaque,    COLONOSCOPY WITH PROPOFOL N/A 10/03/2015   Procedure: COLONOSCOPY WITH PROPOFOL;  Surgeon: Charolett Bumpers, MD;  Location: [facility];  Service: Endoscopy;  Laterality: N/A;   CORONARY ARTERY BYPASS GRAFT  2009   LIMA-LAD, SVG-D1, SVG-OM1-0M2, SVG-PDA   NM MYOCAR PERF EJECTION FRACTION  08/24/2009   Protocol:[facility]ruce, post stress EF 68%, exercise cap , low risk scan   NM MYOCAR PERF WALL MOTION  08/21/2012   protocol:Bruce post stress EF64%, exercise cap . low risk scan   TRANSTHORACIC ECHOCARDIOGRAM  08/24/2009   LV EF55%, normal study   TRANSTHORACIC ECHOCARDIOGRAM  05/03/2008   LV 60% aortic valve thickness mild-moderate increase    Social History  reports that he has quit smoking. His smoking use included cigarettes. He has a 20 pack-year smoking history. He has never used smokeless tobacco. He reports current alcohol use. He reports that he does not use drugs.  Allergies  Allergen Reactions   Nebivolol     Other Reaction(s): bradycardia   Other Swelling and Other (See Comments)    FLU VACCINE   Pollen Extract     Other Reaction(s): Unknown    Family History  Problem Relation Age of Onset   Heart attack Maternal Grandfather   Reviewed on admission  Prior to Admission medications   Medication  Sig Start Date End Date Taking? Authorizing Provider  aspirin 81 MG tablet Take 81 mg by mouth daily.   Yes [provider]  Coenzyme Q10 (CO Q-10) 100 MG CAPS Take 100 mg by mouth daily.   Yes [provider]  ezetimibe (ZETIA) 10 MG tablet TAKE 1 TABLET BY MOUTH EVERY DAY 03/04/23  Yes Lennette Bihari, MD  GLUCOSAMINE-CHONDROITIN PO Take 1 tablet by mouth 2 (two) times daily. 1500/1103mg  per patient   Yes [provider]  Multiple Vitamin (MULTIVITAMIN WITH MINERALS) TABS tablet Take 1 tablet by mouth daily.    Yes [provider]  rosuvastatin (CRESTOR) 40 MG tablet TAKE 1 TABLET BY MOUTH EVERY DAY 12/03/23  Yes Lennette Bihari, MD    Physical Exam: Vitals:   02/12/24 1121 02/12/24 1122 02/12/24 1215 02/12/24 1300  BP: 130/70  127/63 128/60  Pulse: 66  66 69  Resp: 16  (!) 21 20  Temp: 98.5 F (36.9 C)     SpO2: 98%  100% 98%  Weight:  84.8 kg    Height:  5\' 8"  (1.727 m)      Physical Exam Constitutional:      General: He is not in acute distress.    Appearance: Normal appearance.  HENT:     Head: Abrasion (Right occiput, some residual blood present) present.      Nose: No signs of injury.     Mouth/Throat:     Mouth: Mucous membranes are moist.     Pharynx: Oropharynx is clear.  Eyes:     Extraocular Movements: Extraocular movements intact.     Pupils: Pupils are equal, round, and reactive to light.  Cardiovascular:     Rate and Rhythm: Normal rate and regular rhythm.     Pulses: Normal pulses.     Heart sounds: Normal heart sounds.  Pulmonary:     Effort: Pulmonary effort is normal. No respiratory distress.     Breath sounds: Normal breath sounds.  Abdominal:     General: Bowel sounds are normal. There is no distension.     Palpations: Abdomen is soft.     Tenderness: There is no abdominal tenderness.  Musculoskeletal:        General: No swelling or deformity.  Skin:    General: Skin is warm and dry.  Neurological:     General: No focal deficit present.     Mental Status: Mental status is at baseline.   Labs on Admission: I have personally reviewed following labs and imaging studies  CBC: Recent Labs  Lab 02/12/24 1131  WBC 9.8  NEUTROABS 9.0*  HGB 14.8  HCT 44.0  MCV 94.0  PLT 175    Basic Metabolic Panel: Recent Labs  Lab 02/12/24 1131 02/12/24 1244  NA 140  --   K 3.9  --   CL 108  --   CO2 22  --   GLUCOSE 135*  --   BUN 23  --   CREATININE 1.26*  --   CALCIUM 9.0  --   MG  --  1.7    GFR: Estimated Creatinine Clearance: 46.3  mL/min (A) (by C-G formula based on SCr of 1.26 mg/dL (H)).  Liver Function Tests: Recent Labs  Lab 02/12/24 1244  AST 40  ALT 25  ALKPHOS 40  BILITOT 1.1  PROT 7.2  ALBUMIN 3.8    Urine analysis:    Component Value Date/Time   COLORURINE YELLOW 05/04/2008 1050   APPEARANCEUR CLEAR 05/04/2008  1050   LABSPEC 1.008 05/04/2008 1050   PHURINE 7.5 05/04/2008 1050   GLUCOSEU NEGATIVE 05/04/2008 1050   HGBUR NEGATIVE 05/04/2008 1050   BILIRUBINUR NEGATIVE 05/04/2008 1050   KETONESUR NEGATIVE 05/04/2008 1050   PROTEINUR NEGATIVE 05/04/2008 1050   UROBILINOGEN 1.0 05/04/2008 1050   NITRITE NEGATIVE 05/04/2008 1050   LEUKOCYTESUR  05/04/2008 1050    NEGATIVE MICROSCOPIC NOT DONE ON URINES WITH NEGATIVE PROTEIN, BLOOD, LEUKOCYTES, NITRITE, OR GLUCOSE <1000 mg/dL.    Radiological Exams on Admission: DG Chest Port 1 View Result Date: 02/12/2024 CLINICAL DATA:  LH? EXAM: PORTABLE CHEST 1 VIEW COMPARISON:  05/25/2008. FINDINGS: Low lung volume. There are probable atelectatic changes at the lung bases, left more than right. Bilateral lung fields are otherwise clear. Bilateral costophrenic angles are clear. Normal cardio-mediastinal silhouette. There are surgical staples along the heart border and sternotomy wires, status post CABG (coronary artery bypass graft). No acute osseous abnormalities. The soft tissues are within normal limits. IMPRESSION: No active disease. Electronically Signed   By: Jules Schick M.D.   On: 02/12/2024 13:54   EKG: Independently reviewed.  Sinus rhythm at 65 bpm.  Nonspecific T wave changes.  RSR prime in aVR, V1.  Assessment/Plan Principal Problem:   Syncope and collapse Active Problems:   Hyperlipidemia with target LDL less than 70   CAD (coronary artery disease)   S/P CABG x 5   Aortic stenosis   Syncope and collapse > Patient with episode of syncope and collapse this morning after getting up to go to the bathroom no prodrome.  Sudden onset is concerning  for possible cardiogenic.  Unknown time unconscious, was down for total of 15 to 30 minutes with part of that time being conscious.  Did have some lightheadedness afterward. > Wife checked his pulse and felt like it was slow and irregular.  Does have history of bradycardia. > History of moderate aortic stenosis and CAD status post CABG x 5. > Initial troponin mildly elevated at 29, will trend.  Received some IV fluids in the ED. > Does have abrasion with residual blood in the back of his head. - Monitor on telemetry overnight - Orthostatic vital signs - Echocardiogram - CT head - Supportive care ADDENDUM > Transient episode of third-degree AV block with heart rate in the 30s.  Resolved spontaneously. > Currently stable and has converted back to sinus.  Magnesium normal earlier.  Potassium normal. - Cardiology consulted and will see the patient - Pacer pads placed in case block recurs and pacing as needed.  Hyperlipidemia - Continue home Zetia and rosuvastatin  CAD > Status post CABG x 5 - Continue home aspirin, Zetia, rosuvastatin  Aortic stenosis > Last echo in August 2024 with moderate stenosis. - Repeat echo as above  Bradycardia > Being evaluated for syncope as above. - On telemetry   DVT prophylaxis: Lovenox Code Status:   Full Family Communication:  None on admission Disposition Plan:   Patient is from:  Home  Anticipated DC to:  Home  Anticipated DC date:  1 to 2 days  Anticipated DC barriers: None  Consults called:  None Admission status:  Observation, telemetry  Severity of Illness: The appropriate patient status for this patient is OBSERVATION. Observation status is judged to be reasonable and necessary in order to provide the required intensity of service to ensure the patient's safety. The patient's presenting symptoms, physical exam findings, and initial radiographic and laboratory data in the context of their medical condition is felt to  place them at  decreased risk for further clinical deterioration. Furthermore, it is anticipated that the patient will be medically stable for discharge from the hospital within 2 midnights of admission.    Synetta Fail MD [facility]  How to contact the [facility] Attending or Consulting provider 7A - 7P or covering provider during after hours 7P -7A, for this patient?   Check the care team in [facility] and look for a) attending/consulting [facility] provider listed and b) the [facility] team listed Log into [email or web address] and use [place]'s universal password to access. If you do not have the password, please contact the hospital operator. Locate the [facility] provider you are looking for under [facility] and page to a number that you can be directly reached. If you still have difficulty reaching the provider, please page the [facility] (Director on Call) for the Hospitalists listed on amion for assistance.  02/12/2024, 3:02 PM

## 2024-02-12 NOTE — Consult Note (Addendum)
 Cardiology Consultation   Patient ID: Carlos [name] MRN: [id number]; DOB: 1941-04-20  Admit date: 02/12/2024 Date of Consult: 02/12/2024  PCP:  Carlos Penna, MD   [facility] Providers Cardiologist:  Carlos Guadalajara, MD        Patient Profile:   Carlos [name] is a 83 y.o. male with a hx of CAD s/p CABGx5 in 2009, bradycardia, mild-moderate AS, hyperlipidemia who is being seen 02/12/2024 for the evaluation of syncope/collapse at the request of Carlos [name].  History of Present Illness:   Carlos [name] presented to the ED today after a syncopal event at home. Patient reportedly lost consciousness and fell, hitting his head, this morning after waking up around 2:00am and getting a drink of water. It is not clear how long patient was unconscious but he does believe he was on the floor for between 15 and 30 minutes. He denies prodromal symptoms. Upon getting back in bed, patient's spouse reportedly felt a slow pulse. EMS was activated but patient declined transportation. He then had a second episode during a bowel movement/diarrhea, this time near syncope without LOC. Patient reports a second episode of diarrhea in the morning. He called PCP this morning and was convinced to come to the ED by family. In the ED, reassuring labs with normal electrolytes, mild creatinine elevation of 1.26, normal CBC. Troponin 29->38. CXR without acute abnormalities. Per admission note, patient seen with HR in the 30s, CHB transiently/briefly on telemetry.   On exam, patient admits to stomach cramps and diarrhea as noted above with nausea overnight. Attributes to his dinner. Denies any recent dizziness/lightheadedness. Denies chest pain, palpitations, dyspnea. Says that he walked 2 miles yesterday without issue.   Per review patient noted with episodic bradycardia into the 50s in 2022, but was without presyncope/syncope. Nebivolol was discontinued at that time.    Past Medical History:  Diagnosis  Date   Coronary artery disease 2009   LIMA-LAD, SVG-D1, SVG-OM1-0M2, SVG-PDA    Past Surgical History:  Procedure Laterality Date   Carotid Duplex  05/04/2008   bilateral high bifurcations, no obvious plaque,    COLONOSCOPY WITH PROPOFOL N/A 10/03/2015   Procedure: COLONOSCOPY WITH PROPOFOL;  Surgeon: Carlos Bumpers, MD;  Location: [facility];  Service: Endoscopy;  Laterality: N/A;   CORONARY ARTERY BYPASS GRAFT  2009   LIMA-LAD, SVG-D1, SVG-OM1-0M2, SVG-PDA   NM MYOCAR PERF EJECTION FRACTION  08/24/2009   Protocol:Bruce, post stress EF 68%, exercise cap , low risk scan   NM MYOCAR PERF WALL MOTION  08/21/2012   protocol:Bruce post stress EF64%, exercise cap . low risk scan   TRANSTHORACIC ECHOCARDIOGRAM  08/24/2009   LV EF55%, normal study   TRANSTHORACIC ECHOCARDIOGRAM  05/03/2008   LV 60% aortic valve thickness mild-moderate increase     Home Medications:  Prior to Admission medications   Medication Sig Start Date End Date Taking? Authorizing Provider  aspirin 81 MG tablet Take 81 mg by mouth daily.   Yes [provider]  Coenzyme Q10 (CO Q-10) 100 MG CAPS Take 100 mg by mouth daily.   Yes [provider]  ezetimibe (ZETIA) 10 MG tablet TAKE 1 TABLET BY MOUTH EVERY DAY 03/04/23  Yes Carlos Bihari, MD  GLUCOSAMINE-CHONDROITIN PO Take 1 tablet by mouth 2 (two) times daily. 1500/1103mg  per patient   Yes [provider]  Multiple Vitamin (MULTIVITAMIN WITH MINERALS) TABS tablet Take 1 tablet by mouth daily.   Yes [provider]  rosuvastatin (CRESTOR) 40 MG  tablet TAKE 1 TABLET BY MOUTH EVERY DAY 12/03/23  Yes Carlos Bihari, MD    Inpatient Medications: Scheduled Meds:  [START ON 02/13/2024] aspirin  81 mg Oral Daily   enoxaparin (LOVENOX) injection  40 mg Subcutaneous Q24H   [START ON 02/13/2024] ezetimibe  10 mg Oral Daily   [START ON 02/13/2024] rosuvastatin  40 mg Oral Daily   sodium chloride flush  3 mL Intravenous Q12H    Continuous Infusions:  sodium chloride 125 mL/hr at 02/12/24 1234   PRN Meds: acetaminophen **OR** acetaminophen, polyethylene glycol  Allergies:    Allergies  Allergen Reactions   Nebivolol     Other Reaction(s): bradycardia   Other Swelling and Other (See Comments)    FLU VACCINE   Pollen Extract     Other Reaction(s): Unknown    Social History:   Social History   Socioeconomic History   Marital status: Married    Spouse name: Not on file   Number of children: Not on file   Years of education: Not on file   Highest education level: Not on file  Occupational History   Not on file  Tobacco Use   Smoking status: Former    Current packs/day: 1.00    Average packs/day: 1 pack/day for 20.0 years (20.0 ttl pk-yrs)    Types: Cigarettes   Smokeless tobacco: Never   Tobacco comments:    quit about 15 years ago  Substance and Sexual Activity   Alcohol use: Yes    Comment: 1 glass of wine daily   Drug use: No   Sexual activity: Not on file  Other Topics Concern   Not on file  Social History Narrative   Not on file   Social Drivers of Health   Financial Resource Strain: Not on file  Food Insecurity: No Food Insecurity (02/12/2024)   Hunger Vital Sign    Worried About Running Out of Food in the Last Year: Never true    Ran Out of Food in the Last Year: Never true  Transportation Needs: No Transportation Needs (02/12/2024)   PRAPARE - Administrator, Civil Service (Medical): No    Lack of Transportation (Non-Medical): No  Physical Activity: Not on file  Stress: Not on file  Social Connections: Socially Integrated (02/12/2024)   Social Connection and Isolation Panel [NHANES]    Frequency of Communication with Friends and Family: More than three times a week    Frequency of Social Gatherings with Friends and Family: Once a week    Attends Religious Services: More than 4 times per year    Active Member of [employer] or Organizations: No    Attends Museum/gallery exhibitions officer: 1 to 4 times per year    Marital Status: Married  Catering manager Violence: Not At Risk (02/12/2024)   Humiliation, Afraid, Rape, and Kick questionnaire    Fear of Current or Ex-Partner: No    Emotionally Abused: No    Physically Abused: No    Sexually Abused: No    Family History:    Family History  Problem Relation Age of Onset   Heart attack Maternal Grandfather      ROS:  Please see the history of present illness.   All other ROS reviewed and negative.     Physical Exam/Data:   Vitals:   02/12/24 1514 02/12/24 1530 02/12/24 1619 02/12/24 1742  BP:  (!) 125/51 (!) 132/58 (!) 127/54  Pulse:  68 71 70  Resp:  19  18 18  Temp: 99.8 F (37.7 C)  100 F (37.8 C)   TempSrc: Oral  Oral   SpO2:  98% 96% 96%  Weight:      Height:        Intake/Output Summary (Last 24 hours) at 02/12/2024 1801 Last data filed at 02/12/2024 1700 Gross per 24 hour  Intake 240 ml  Output --  Net 240 ml      02/12/2024   11:22 AM 10/03/2023   10:20 AM 08/21/2022   10:43 AM  Last 3 Weights  Weight (lbs) 187 lb 189 lb 3.2 oz 190 lb  Weight (kg) 84.823 kg 85.821 kg 86.183 kg     Body mass index is 28.43 kg/m.  General:  Well nourished, well developed, in no acute distress HEENT: normal Neck: no JVD Vascular: No carotid bruits; Distal pulses 2+ bilaterally Cardiac:  normal S1, S2; RRR; 4/6 systolic crescendo-decrescendo murmur at RUSB Lungs:  clear to auscultation bilaterally, no wheezing, rhonchi or rales  Abd: soft, nontender, no hepatomegaly  Ext: no edema Musculoskeletal:  No deformities, BUE and BLE strength normal and equal Skin: warm and dry  Neuro:  CNs 2-12 intact, no focal abnormalities noted Psych:  Normal affect   EKG:  The EKG was personally reviewed and demonstrates:  sinus rhythm Telemetry:  Telemetry was personally reviewed and demonstrates:  sinus rhythm with transient CHB, junctional escape ~40bpm.   Relevant CV Studies:  07/02/23  TTE  IMPRESSIONS     1. Left ventricular ejection fraction, by estimation, is 60 to 65%. The  left ventricle has normal function. The left ventricle has no regional  wall motion abnormalities. There is mild left ventricular hypertrophy.  Left ventricular diastolic parameters  are consistent with Grade I diastolic dysfunction (impaired relaxation).  The average left ventricular global longitudinal strain is -20.3 %. The  global longitudinal strain is normal.   2. Right ventricular systolic function is normal. The right ventricular  size is normal.   3. The mitral valve is normal in structure. No evidence of mitral valve  regurgitation. No evidence of mitral stenosis. Moderate mitral annular  calcification.   4. The aortic valve is tricuspid. There is moderate calcification of the  aortic valve. There is moderate thickening of the aortic valve. Aortic  valve regurgitation is not visualized. Mild to moderate aortic valve  stenosis. Aortic valve mean gradient  measures 14.0 mmHg. Aortic valve Vmax measures 2.48 m/s.   5. The inferior vena cava is normal in size with greater than 50%  respiratory variability, suggesting right atrial pressure of 3 mmHg.   FINDINGS   Left Ventricle: Left ventricular ejection fraction, by estimation, is 60  to 65%. The left ventricle has normal function. The left ventricle has no  regional wall motion abnormalities. The average left ventricular global  longitudinal strain is -20.3 %.  The global longitudinal strain is normal. 3D ejection fraction reviewed  and evaluated as part of the interpretation. Alternate measurement of EF  is felt to be most reflective of LV function. The left ventricular  internal cavity size was normal in size.  There is mild left ventricular hypertrophy. Left ventricular diastolic  parameters are consistent with Grade I diastolic dysfunction (impaired  relaxation).   Right Ventricle: The right ventricular size is normal. No  increase in  right ventricular wall thickness. Right ventricular systolic function is  normal.   Left Atrium: Left atrial size was normal in size.   Right Atrium: Right atrial size was  normal in size.   Pericardium: There is no evidence of pericardial effusion.   Mitral Valve: The mitral valve is normal in structure. Moderate mitral  annular calcification. No evidence of mitral valve regurgitation. No  evidence of mitral valve stenosis.   Tricuspid Valve: The tricuspid valve is normal in structure. Tricuspid  valve regurgitation is not demonstrated. No evidence of tricuspid  stenosis.   Aortic Valve: The aortic valve is tricuspid. There is moderate  calcification of the aortic valve. There is moderate thickening of the  aortic valve. Aortic valve regurgitation is not visualized. Mild to  moderate aortic stenosis is present. Aortic valve  mean gradient measures 14.0 mmHg. Aortic valve peak gradient measures 24.6  mmHg. Aortic valve area, by VTI measures 2.00 cm.   Pulmonic Valve: The pulmonic valve was normal in structure. Pulmonic valve  regurgitation is not visualized. No evidence of pulmonic stenosis.   Aorta: The aortic root is normal in size and structure.   Venous: The inferior vena cava is normal in size with greater than 50%  respiratory variability, suggesting right atrial pressure of 3 mmHg.   IAS/Shunts: No atrial level shunt detected by color flow Doppler.   Laboratory Data:  High Sensitivity Troponin:   Recent Labs  Lab 02/12/24 1244  TROPONINIHS 29*     Chemistry Recent Labs  Lab 02/12/24 1131 02/12/24 1244  NA 140  --   K 3.9  --   CL 108  --   CO2 22  --   GLUCOSE 135*  --   BUN 23  --   CREATININE 1.26*  --   CALCIUM 9.0  --   MG  --  1.7  GFRNONAA 56*  --   ANIONGAP 10  --     Recent Labs  Lab 02/12/24 1244  PROT 7.2  ALBUMIN 3.8  AST 40  ALT 25  ALKPHOS 40  BILITOT 1.1   Lipids No results for input(s): "CHOL", "TRIG", "HDL",  "LABVLDL", "LDLCALC", "CHOLHDL" in the last 168 hours.  Hematology Recent Labs  Lab 02/12/24 1131  WBC 9.8  RBC 4.68  HGB 14.8  HCT 44.0  MCV 94.0  MCH 31.6  MCHC 33.6  RDW 12.5  PLT 175   Thyroid No results for input(s): "TSH", "FREET4" in the last 168 hours.  BNPNo results for input(s): "BNP", "PROBNP" in the last 168 hours.  DDimer No results for input(s): "DDIMER" in the last 168 hours.   Radiology/Studies:  DG Chest Port 1 View Result Date: 02/12/2024 CLINICAL DATA:  LH? EXAM: PORTABLE CHEST 1 VIEW COMPARISON:  05/25/2008. FINDINGS: Low lung volume. There are probable atelectatic changes at the lung bases, left more than right. Bilateral lung fields are otherwise clear. Bilateral costophrenic angles are clear. Normal cardio-mediastinal silhouette. There are surgical staples along the heart border and sternotomy wires, status post CABG (coronary artery bypass graft). No acute osseous abnormalities. The soft tissues are within normal limits. IMPRESSION: No active disease. Electronically Signed   By: Jules Schick M.D.   On: 02/12/2024 13:54     Assessment and Plan:   Syncope and Collapse Transient high grade AVB/CHB Patient with syncopal episode this morning around 2am while walking to/in the bathroom. Hx bradycardia in 2022, resolved with discontinuing beta blocker. Electrolytes normal. ECG this admission with normal sinus rhythm. Telemetry with brief arrhythmia that appears consistent with CHB with junctional escape. Possible second degree block as well. Given GI upset and diarrhea, vasovagal episode should be considered, though given reported  length of LOC, less likely than arrhythmia.  Avoid AV nodal agents Check TSH Monitor telemetry overnight Echocardiogram in AM EP consult      CAD s/p CABGx5 LIMA was placed to the LAD, a SVG to the second diagonal branch of the LAD, sequential vein graft to the first and second obtuse marginal branches of the circumflex coronary  artery, and vein graft to the PDA branch of the RCA with endoscopic vein harvesting from both thighs. Troponin this admission 29>38. No chest pain reported by patient. Given syncope with suspicion for transient complete heart block as well as AKI with multiple episodes of diarrhea, elevation likely represents demand ischemia.  Continue ASA, Zetia, Crestor  Mild to Moderate Aortic stenosis 07/02/23 TTE with mild to moderate aortic stenosis with mean gradient at . 4/6 systolic crescendo-decrescendo murmur at RUSB. Although progressive aortic stenosis less likely as culprit of syncope given lack of exertional/HF symptoms, will repeat TTE.    GI upset/Diarrhea Patient with dry mucus membranes on physical exam, small AKI. Encouraged oral hydration to replenish lost fluids.   Risk Assessment/Risk Scores:                For questions or updates, please contact [facility] Please consult [email or web address] for contact info under    Signed, Perlie Gold, PA-C  02/12/2024 6:01 PM  Personally seen and examined. Agree with above.  83 year old post CABG with mild to moderate aortic stenosis 3/6 systolic murmur with prior episode of atrial fibrillation status post ablation x 2 at [facility] here with syncopal episode at home surrounding by diarrhea potential GI virus.  Telemetry personally reviewed as above does show some transient complete heart block with junctional escape.  He has had conduction disorder in the past nebivolol was stopped.  Currently he is comfortable in bed heart rates in the 60s normal PR interval.  Wife in room.  Son in room.  CT of head negative.  TSH pending.  Transient complete heart block - Likely does have some underlying conduction disease.  I am not convinced however that this was the etiology for his syncope but it certainly could be.  He does have an underlying story of diarrhea which may have led to some dehydration/hyper vagal tone leading to his fainting spell.   Nonetheless, I think it does make sense for [place] to have EP evaluate in the morning to weigh in.  Pacemaker may be warranted especially if overnight he has worsening conduction disease or symptomatic bradycardia.  GI upset - Hydration.  Supportive care.  Aortic stenosis - Previously mild to moderate in 2024.  Repeating echocardiogram.  If severe, could relate to syncope.  Would be unusual for valve to progress so quickly.  We will continue to follow.  If workup is unremarkable and EP does not believe that he needs a pacemaker (we will make sure that he is n.p.o. past midnight just in case), we will likely place a Zio patch monitor for 2-week evaluation of conduction system.  Donato Schultz, MD

## 2024-02-12 NOTE — ED Provider Notes (Signed)
 [place] EMERGENCY DEPARTMENT AT [facility] Provider Note   CSN: [id number] Arrival date & time: 02/12/24  1117     History  No chief complaint on file.   Carlos [name] is a 83 y.o. male.  HPI Patient presents after episode of syncope.  He is accompanied by his wife.  He now states that he feels fine.  However, earlier today, and early morning after getting ready to go to the bathroom patient had an episode of syncope.  He was on the ground for an unknown time.  Patient's wife found him on the ground, noted his pulse to be abnormal, with irregular and slow.  Patient did awaken and was evaluated by EMT but declined transport. Patient was encouraged to evaluation after he awoke, discussed with his daughter.  Patient notes multiple medical issues, has had prior bypass, states that he takes his medication as directed    Home Medications Prior to Admission medications   Medication Sig Start Date End Date Taking? Authorizing Provider  aspirin 81 MG tablet Take 81 mg by mouth daily.   Yes [provider]  Coenzyme Q10 (CO Q-10) 100 MG CAPS Take 100 mg by mouth daily.   Yes [provider]  ezetimibe (ZETIA) 10 MG tablet TAKE 1 TABLET BY MOUTH EVERY DAY 03/04/23  Yes Lennette Bihari, MD  GLUCOSAMINE-CHONDROITIN PO Take 1 tablet by mouth 2 (two) times daily. 1500/1103mg  per patient   Yes [provider]  Multiple Vitamin (MULTIVITAMIN WITH MINERALS) TABS tablet Take 1 tablet by mouth daily.   Yes [provider]  rosuvastatin (CRESTOR) 40 MG tablet TAKE 1 TABLET BY MOUTH EVERY DAY 12/03/23  Yes Lennette Bihari, MD      Allergies    Nebivolol, Other, and Pollen extract    Review of Systems   Review of Systems  Physical Exam Updated Vital Signs BP 128/60   Pulse 69   Temp 98.5 F (36.9 C)   Resp 20   Ht 5\' 8"  (1.727 m)   Wt 84.8 kg   SpO2 98%   BMI 28.43 kg/m  Physical Exam Vitals and nursing note reviewed.  Constitutional:       General: He is not in acute distress.    Appearance: He is well-developed.  HENT:     Head: Normocephalic and atraumatic.  Eyes:     Conjunctiva/sclera: Conjunctivae normal.  Cardiovascular:     Rate and Rhythm: Normal rate and regular rhythm.  Pulmonary:     Effort: Pulmonary effort is normal. No respiratory distress.     Breath sounds: No stridor.  Abdominal:     General: There is no distension.  Skin:    General: Skin is warm and dry.  Neurological:     Mental Status: He is alert and oriented to person, place, and time.     ED Results / Procedures / Treatments   Labs (all labs ordered are listed, but only abnormal results are displayed) Labs Reviewed  CBC WITH DIFFERENTIAL/PLATELET - Abnormal; Notable for the following components:      Result Value   Neutro Abs 9.0 (*)    Lymphs Abs 0.3 (*)    All other components within normal limits  BASIC METABOLIC PANEL - Abnormal; Notable for the following components:   Glucose, Bld 135 (*)    Creatinine, Ser 1.26 (*)    GFR, Estimated 56 (*)    All other components within normal limits  CBG MONITORING, ED - Abnormal;  Notable for the following components:   Glucose-Capillary 112 (*)    All other components within normal limits  TROPONIN I (HIGH SENSITIVITY) - Abnormal; Notable for the following components:   Troponin I (High Sensitivity) 29 (*)    All other components within normal limits  LIPASE, BLOOD  ETHANOL  MAGNESIUM  HEPATIC FUNCTION PANEL  TROPONIN I (HIGH SENSITIVITY)    EKG EKG Interpretation Date/Time:  Wednesday February 12 2024 11:24:27 EDT Ventricular Rate:  65 PR Interval:  186 QRS Duration:  90 QT Interval:  424 QTC Calculation: 440 R Axis:   -5  Text Interpretation: Normal sinus rhythm Minimal voltage criteria for LVH, may be normal variant ( R in aVL ) Lateral infarct , age undetermined Possible Inferior infarct , age undetermined Abnormal ECG When compared with ECG of 03-Oct-2023 10:25, PREVIOUS ECG  IS PRESENT Confirmed by Gerhard Munch [phone]) on 02/12/2024 11:47:45 AM  Radiology DG Chest Port 1 View Result Date: 02/12/2024 CLINICAL DATA:  LH? EXAM: PORTABLE CHEST 1 VIEW COMPARISON:  05/25/2008. FINDINGS: Low lung volume. There are probable atelectatic changes at the lung bases, left more than right. Bilateral lung fields are otherwise clear. Bilateral costophrenic angles are clear. Normal cardio-mediastinal silhouette. There are surgical staples along the heart border and sternotomy wires, status post CABG (coronary artery bypass graft). No acute osseous abnormalities. The soft tissues are within normal limits. IMPRESSION: No active disease. Electronically Signed   By: Jules Schick M.D.   On: 02/12/2024 13:54    Procedures Procedures    Medications Ordered in ED Medications  0.9 %  sodium chloride infusion ( Intravenous New Bag/Given 02/12/24 1234)    ED Course/ Medical Decision Making/ A&P                                 Medical Decision Making Male presents after episode of syncope with possible prolonged downtime.  Given the unclear circumstances, broad differential including arrhythmia, vagal episode, dehydration, orthostasis, ACS.  Patient is afebrile, not hypotensive and there is no initial suspicion for infection. Cardiac 70 sinus normal pulse ox 100% room air normal  Amount and/or Complexity of Data Reviewed Independent Historian: spouse External Data Reviewed: notes. Labs: ordered. Decision-making details documented in ED Course. Radiology: ordered and independent interpretation performed. Decision-making details documented in ED Course. ECG/medicine tests: ordered and independent interpretation performed. Decision-making details documented in ED Course.  Risk Decision regarding hospitalization. Diagnosis or treatment significantly limited by social determinants of health.   2:34 PM On repeat exam patient remains hemodynamically unremarkable, no complaints.   Initial labs notable for slight elevation in troponin, given the context of unwitnessed syncope, downtime, with the wife's reporting of possible irregular bradycardia, patient will be admitted for further monitoring, management those initial studies are somewhat reassuring, no evidence for bacteremia, sepsis, no pneumonia on x-ray.  Second troponin pending on admission.  Final Clinical Impression(s) / ED Diagnoses Final diagnoses:  Syncope and collapse    Rx / DC Orders ED Discharge Orders     None         Gerhard Munch, MD 02/12/24 1435

## 2024-02-13 ENCOUNTER — Observation Stay (HOSPITAL_BASED_OUTPATIENT_CLINIC_OR_DEPARTMENT_OTHER)

## 2024-02-13 DIAGNOSIS — I350 Nonrheumatic aortic (valve) stenosis: Secondary | ICD-10-CM | POA: Diagnosis not present

## 2024-02-13 DIAGNOSIS — R197 Diarrhea, unspecified: Secondary | ICD-10-CM

## 2024-02-13 DIAGNOSIS — R55 Syncope and collapse: Secondary | ICD-10-CM

## 2024-02-13 DIAGNOSIS — Z951 Presence of aortocoronary bypass graft: Secondary | ICD-10-CM | POA: Diagnosis not present

## 2024-02-13 LAB — COMPREHENSIVE METABOLIC PANEL WITH GFR
ALT: 24 U/L (ref 0–44)
AST: 37 U/L (ref 15–41)
Albumin: 3 g/dL — ABNORMAL LOW (ref 3.5–5.0)
Alkaline Phosphatase: 33 U/L — ABNORMAL LOW (ref 38–126)
Anion gap: 8 (ref 5–15)
BUN: 20 mg/dL (ref 8–23)
CO2: 20 mmol/L — ABNORMAL LOW (ref 22–32)
Calcium: 7.7 mg/dL — ABNORMAL LOW (ref 8.9–10.3)
Chloride: 111 mmol/L (ref 98–111)
Creatinine, Ser: 1.02 mg/dL (ref 0.61–1.24)
GFR, Estimated: >60 mL/min (ref >60)
Glucose, Bld: 113 mg/dL — ABNORMAL HIGH (ref 70–99)
Potassium: 3.1 mmol/L — ABNORMAL LOW (ref 3.5–5.1)
Sodium: 139 mmol/L (ref 135–145)
Total Bilirubin: 0.9 mg/dL (ref 0.0–1.2)
Total Protein: 5.7 g/dL — ABNORMAL LOW (ref 6.5–8.1)

## 2024-02-13 LAB — CBC
HCT: 38.6 % — ABNORMAL LOW (ref 39.0–52.0)
Hemoglobin: 13 g/dL (ref 13.0–17.0)
MCH: 31.3 pg (ref 26.0–34.0)
MCHC: 33.7 g/dL (ref 30.0–36.0)
MCV: 92.8 fL (ref 80.0–100.0)
Platelets: 129 10*3/uL — ABNORMAL LOW (ref 150–400)
RBC: 4.16 MIL/uL — ABNORMAL LOW (ref 4.22–5.81)
RDW: 12.8 % (ref 11.5–15.5)
WBC: 5.9 10*3/uL (ref 4.0–10.5)
nRBC: 0 % (ref 0.0–0.2)

## 2024-02-13 LAB — ECHOCARDIOGRAM COMPLETE
AR max vel: 1.71 cm2
AV Area VTI: 1.88 cm2
AV Area mean vel: 1.67 cm2
AV Mean grad: 20 mmHg
AV Peak grad: 31.6 mmHg
Ao pk vel: 2.81 m/s
Area-P 1/2: 1.89 cm2
Calc EF: 61.1 %
Height: 68 in
MV VTI: 2.41 cm2
S' Lateral: 2.8 cm
Single Plane A2C EF: 61.8 %
Single Plane A4C EF: 60.5 %
Weight: 3056 [oz_av]

## 2024-02-13 LAB — TSH: TSH: 0.558 u[IU]/mL (ref 0.350–4.500)

## 2024-02-13 LAB — MAGNESIUM: Magnesium: 1.7 mg/dL (ref 1.7–2.4)

## 2024-02-13 LAB — GLUCOSE, CAPILLARY: Glucose-Capillary: 94 mg/dL (ref 70–99)

## 2024-02-13 MED ORDER — PERFLUTREN LIPID MICROSPHERE
1.0000 mL | INTRAVENOUS | Status: AC | PRN
  Administered 2024-02-13: 2 mL via INTRAVENOUS

## 2024-02-13 MED ORDER — POTASSIUM CHLORIDE CRYS ER 20 MEQ PO TBCR
40.0000 meq | EXTENDED_RELEASE_TABLET | Freq: Two times a day (BID) | ORAL | Status: AC
  Administered 2024-02-13 – 2024-02-14 (×2): 40 meq via ORAL
  Filled 2024-02-13 (×2): qty 2

## 2024-02-13 MED ORDER — POTASSIUM CHLORIDE CRYS ER 20 MEQ PO TBCR
40.0000 meq | EXTENDED_RELEASE_TABLET | ORAL | Status: AC
  Administered 2024-02-13: 40 meq via ORAL
  Filled 2024-02-13: qty 2

## 2024-02-13 NOTE — Care Management Obs Status (Signed)
 MEDICARE OBSERVATION STATUS NOTIFICATION   Patient Details  Name: Carlos [name] MRN: [id number] Date of Birth: 1941-03-03   Medicare Observation Status Notification Given:  Yes    Leone Haven, RN 02/13/2024, 1:13 PM

## 2024-02-13 NOTE — Progress Notes (Signed)
 PROGRESS NOTE  Carlos [name] GNF:[id number] DOB: Jul 24, 1941 DOA: 02/12/2024 PCP: Alysia Penna, MD  HPI/Recap of past 24 hours: Carlos [name] is a 83 y.o. male with medical history significant of bradycardia, aortic stenosis, hyperlipidemia, CAD, status post CABG x 5 presenting after episode of syncope.  Patient reported shortly after he got up while he was heading to the bathroom, lost consciousness, hit his head, with noted abrasion, may have had LOC for about 15 minutes, no noted prodrome prior to the event.  Patient had another episode of possible presyncope with lightheadedness shortly after having a bowel movement.  Of note, patient has been having persistent diarrhea for the past 2 days.  Wife reportedly found him on the ground and checked his pulse and felt like it was slow and abnormal.  Patient was transported to the ED by family, noted vital signs stable on presentation. Lab workup included CMP with creatinine stable 1.26, Troponin minimally elevated with flat trend.  While on telemetry, transient CHB was noted with heart rate in the 40s.  Cardiology consulted.  Patient admitted for further management.   Day, patient denies any new complaints, still having diarrhea, with 3 episodes so far today.  Denies any abdominal pain, nausea/vomiting, fever/chills, chest pain, shortness of breath, dizziness or any further syncopal episodes.  Was able to ambulate with PT without any issues.  Discussed extensively with wife at bedside.   Assessment/Plan: Principal Problem:   Syncope and collapse Active Problems:   Hyperlipidemia with target LDL less than 70   CAD (coronary artery disease)   S/P CABG x 5   Aortic stenosis   Syncope Bradycardia Orthostatic hypotension positive, possibly vasovagal, history of 2 days of diarrhea Rule out cardiac etiology given transient CHB was noted with heart rate in the 40s on telemetry Troponin with flat trend 29-->38 Echo with EF of 60 to 65%, no  regional wall motion abnormality, grade 2 diastolic dysfunction, moderate aortic valve stenosis Cardiology/EP consulted, appreciate recs CT head unremarkable for any acute intracranial abnormality Continue IV hydration Orthostatic vital signs daily Fall precautions  Diarrhea Noted watery/loose stools, denies any nausea/vomiting, abdominal pain GI panel sent Continue IV fluids  Hypokalemia Replace as needed  Moderate aortic stenosis Echo as above, remains unchanged from previous  History of CAD S/p CABG x 5 Continue home aspirin, Zetia, rosuvastatin       Estimated body mass index is 29.04 kg/m as calculated from the following:   Height as of this encounter: 5\' 8"  (1.727 m).   Weight as of this encounter: 86.6 kg.     Code Status: Full  Family Communication: Discussed with wife at bedside  Disposition Plan: Status is: Observation The patient remains OBS appropriate and will d/c before 2 midnights.      Consultants: Cardiology/EP  Procedures: None  Antimicrobials: None  DVT prophylaxis: Lovenox   Objective: Vitals:   02/13/24 0355 02/13/24 0356 02/13/24 0715 02/13/24 1327  BP: (!) 108/59  (!) 108/59 109/60  Pulse: (!) 58 (!) 58 (!) 54 60  Resp: 18 17 18 17   Temp: 97.9 F (36.6 C)  98.6 F (37 C)   TempSrc: Oral  Oral Oral  SpO2: 94% 94% 97% 97%  Weight:      Height:        Intake/Output Summary (Last 24 hours) at 02/13/2024 1503 Last data filed at 02/13/2024 0303 Gross per 24 hour  Intake 2019.68 ml  Output --  Net 2019.68 ml   [employer]  02/12/24 1122 02/13/24 0015  Weight: 84.8 kg 86.6 kg    Exam: General: NAD  Cardiovascular: S1, S2 present Respiratory: CTAB Abdomen: Soft, nontender, nondistended, bowel sounds present Musculoskeletal: No bilateral pedal edema noted Skin: Normal Psychiatry: Normal mood   Data Reviewed: CBC: Recent Labs  Lab 02/12/24 1131 02/13/24 0305  WBC 9.8 5.9  NEUTROABS 9.0*  --   HGB 14.8  13.0  HCT 44.0 38.6*  MCV 94.0 92.8  PLT 175 129*   Basic Metabolic Panel: Recent Labs  Lab 02/12/24 1131 02/12/24 1244 02/13/24 0305  NA 140  --  139  K 3.9  --  3.1*  CL 108  --  111  CO2 22  --  20*  GLUCOSE 135*  --  113*  BUN 23  --  20  CREATININE 1.26*  --  1.02  CALCIUM 9.0  --  7.7*  MG  --  1.7 1.7   GFR: Estimated Creatinine Clearance: 57.7 mL/min (by C-G formula based on SCr of 1.02 mg/dL). Liver Function Tests: Recent Labs  Lab 02/12/24 1244 02/13/24 0305  AST 40 37  ALT 25 24  ALKPHOS 40 33*  BILITOT 1.1 0.9  PROT 7.2 5.7*  ALBUMIN 3.8 3.0*   Recent Labs  Lab 02/12/24 1244  LIPASE 27   No results for input(s): "AMMONIA" in the last 168 hours. Coagulation Profile: No results for input(s): "INR", "PROTIME" in the last 168 hours. Cardiac Enzymes: No results for input(s): "CKTOTAL", "CKMB", "CKMBINDEX", "TROPONINI" in the last 168 hours. BNP (last 3 results) No results for input(s): "PROBNP" in the last 8760 hours. HbA1C: No results for input(s): "HGBA1C" in the last 72 hours. CBG: Recent Labs  Lab 02/12/24 1232 02/13/24 0526  GLUCAP 112* 94   Lipid Profile: No results for input(s): "CHOL", "HDL", "LDLCALC", "TRIG", "CHOLHDL", "LDLDIRECT" in the last 72 hours. Thyroid Function Tests: Recent Labs    02/13/24 0305  TSH 0.558   Anemia Panel: No results for input(s): "VITAMINB12", "FOLATE", "FERRITIN", "TIBC", "IRON", "RETICCTPCT" in the last 72 hours. Urine analysis:    Component Value Date/Time   COLORURINE YELLOW 05/04/2008 1050   APPEARANCEUR CLEAR 05/04/2008 1050   LABSPEC 1.008 05/04/2008 1050   PHURINE 7.5 05/04/2008 1050   GLUCOSEU NEGATIVE 05/04/2008 1050   HGBUR NEGATIVE 05/04/2008 1050   BILIRUBINUR NEGATIVE 05/04/2008 1050   KETONESUR NEGATIVE 05/04/2008 1050   PROTEINUR NEGATIVE 05/04/2008 1050   UROBILINOGEN 1.0 05/04/2008 1050   NITRITE NEGATIVE 05/04/2008 1050   LEUKOCYTESUR  05/04/2008 1050    NEGATIVE MICROSCOPIC  NOT DONE ON URINES WITH NEGATIVE PROTEIN, BLOOD, LEUKOCYTES, NITRITE, OR GLUCOSE <1000 mg/dL.   Sepsis Labs: @LABRCNTIP (procalcitonin:4,lacticidven:4)  )No results found for this or any previous visit (from the past 240 hours).    Studies: ECHOCARDIOGRAM COMPLETE Result Date: 02/13/2024    ECHOCARDIOGRAM REPORT   Patient Name:   Carlos [name] [facility] Date of Exam: 02/13/2024 Medical Rec #:  [id number]        Height:       68.0 in Accession #:    [id number]       Weight:       191.0 lb Date of Birth:  10/12/1941        BSA:          2.004 m Patient Age:    82 years         BP:           108/59 mmHg Patient Gender: M  HR:           54 bpm. Exam Location:  Inpatient Procedure: 2D Echo, Cardiac Doppler, Color Doppler and Intracardiac            Opacification Agent (Both Spectral and Color Flow Doppler were            utilized during procedure). Indications:    Syncope  History:        Patient has prior history of Echocardiogram examinations, most                 recent 07/02/2023. CAD, Prior CABG; Aortic Valve Disease.  Sonographer:    Amy Chionchio Referring Phys: [id number] Cecille Po MELVIN IMPRESSIONS  1. Left ventricular ejection fraction, by estimation, is 60 to 65%. The left ventricle has normal function. The left ventricle has no regional wall motion abnormalities. Left ventricular diastolic parameters are consistent with Grade II diastolic dysfunction (pseudonormalization).  2. Right ventricular systolic function is normal. The right ventricular size is normal.  3. The mitral valve is normal in structure. No evidence of mitral valve regurgitation. No evidence of mitral stenosis. Moderate mitral annular calcification.  4. The aortic valve is normal in structure. There is moderate calcification of the aortic valve. There is moderate thickening of the aortic valve. Aortic valve regurgitation is not visualized. Moderate aortic valve stenosis. Aortic valve area, by VTI measures 1.88 cm. Aortic valve  mean gradient measures 20.0 mmHg. Aortic valve Vmax measures 2.81 m/s.  5. Aortic dilatation noted. There is mild dilatation of the aortic root, measuring 40 mm.  6. The inferior vena cava is normal in size with greater than 50% respiratory variability, suggesting right atrial pressure of 3 mmHg. FINDINGS  Left Ventricle: Left ventricular ejection fraction, by estimation, is 60 to 65%. The left ventricle has normal function. The left ventricle has no regional wall motion abnormalities. Definity contrast agent was given IV to delineate the left ventricular  endocardial borders. The left ventricular internal cavity size was normal in size. There is no left ventricular hypertrophy. Left ventricular diastolic parameters are consistent with Grade II diastolic dysfunction (pseudonormalization). Right Ventricle: The right ventricular size is normal. No increase in right ventricular wall thickness. Right ventricular systolic function is normal. Left Atrium: Left atrial size was normal in size. Right Atrium: Right atrial size was normal in size. Pericardium: There is no evidence of pericardial effusion. Mitral Valve: The mitral valve is normal in structure. Moderate mitral annular calcification. No evidence of mitral valve regurgitation. No evidence of mitral valve stenosis. MV peak gradient, 6.2 mmHg. The mean mitral valve gradient is 2.0 mmHg. Tricuspid Valve: The tricuspid valve is normal in structure. Tricuspid valve regurgitation is mild . No evidence of tricuspid stenosis. Aortic Valve: The aortic valve is normal in structure. There is moderate calcification of the aortic valve. There is moderate thickening of the aortic valve. Aortic valve regurgitation is not visualized. Moderate aortic stenosis is present. Aortic valve mean gradient measures 20.0 mmHg. Aortic valve peak gradient measures 31.6 mmHg. Aortic valve area, by VTI measures 1.88 cm. Pulmonic Valve: The pulmonic valve was normal in structure. Pulmonic valve  regurgitation is not visualized. No evidence of pulmonic stenosis. Aorta: Aortic dilatation noted. There is mild dilatation of the aortic root, measuring 40 mm. Venous: The inferior vena cava is normal in size with greater than 50% respiratory variability, suggesting right atrial pressure of 3 mmHg. IAS/Shunts: No atrial level shunt detected by color flow Doppler.  LEFT VENTRICLE PLAX 2D  LVIDd:         4.20 cm     Diastology LVIDs:         2.80 cm     LV e' medial:    6.31 cm/s LV PW:         1.00 cm     LV E/e' medial:  19.5 LV IVS:        1.10 cm     LV e' lateral:   6.64 cm/s LVOT diam:     2.10 cm     LV E/e' lateral: 18.5 LV SV:         123 LV SV Index:   61 LVOT Area:     3.46 cm  LV Volumes (MOD) LV vol d, MOD A2C: 84.6 ml LV vol d, MOD A4C: 99.7 ml LV vol s, MOD A2C: 32.3 ml LV vol s, MOD A4C: 39.4 ml LV SV MOD A2C:     52.3 ml LV SV MOD A4C:     99.7 ml LV SV MOD BP:      56.7 ml RIGHT VENTRICLE          IVC RV Basal diam:  3.10 cm  IVC diam: 2.40 cm TAPSE (M-mode): 1.3 cm LEFT ATRIUM             Index        RIGHT ATRIUM          Index LA Vol (A2C):   62.2 ml 31.04 ml/m  RA Area:     9.23 cm LA Vol (A4C):   67.2 ml 33.53 ml/m  RA Volume:   15.40 ml 7.68 ml/m LA Biplane Vol: 64.3 ml 32.08 ml/m  AORTIC VALVE                     PULMONIC VALVE AV Area (Vmax):    1.71 cm      PV Vmax:       0.93 m/s AV Area (Vmean):   1.67 cm      PV Peak grad:  3.4 mmHg AV Area (VTI):     1.88 cm AV Vmax:           281.00 cm/s AV Vmean:          213.333 cm/s AV VTI:            0.653 m AV Peak Grad:      31.6 mmHg AV Mean Grad:      20.0 mmHg LVOT Vmax:         139.00 cm/s LVOT Vmean:        103.000 cm/s LVOT VTI:          0.354 m LVOT/AV VTI ratio: 0.54  AORTA Ao Root diam: 4.00 cm Ao Asc diam:  3.60 cm MITRAL VALVE                TRICUSPID VALVE MV Area (PHT): 1.89 cm     TR Peak grad:   24.0 mmHg MV Area VTI:   2.41 cm     TR Vmax:        245.00 cm/s MV Peak grad:  6.2 mmHg MV Mean grad:  2.0 mmHg     SHUNTS MV  Vmax:       1.25 m/s     Systemic VTI:  0.35 m MV Vmean:      70.8 cm/s    Systemic Diam: 2.10 cm MV Decel Time: 401 msec MV E velocity: 123.00 cm/s  MV A velocity: 107.00 cm/s MV E/A ratio:  1.15 Donato Schultz MD Electronically signed by Donato Schultz MD Signature Date/Time: 02/13/2024/11:16:42 AM    Final    CT HEAD WO CONTRAST ( ) Result Date: 02/12/2024 CLINICAL DATA:  Head trauma EXAM: CT HEAD WITHOUT CONTRAST TECHNIQUE: Contiguous axial images were obtained from the base of the skull through the vertex without intravenous contrast. RADIATION DOSE REDUCTION: This exam was performed according to the departmental dose-optimization program which includes automated exposure control, adjustment of the mA and/or kV according to patient size and/or use of iterative reconstruction technique. COMPARISON:  03/17/2021 FINDINGS: Brain: No evidence of acute infarction, hemorrhage, hydrocephalus, extra-axial collection or mass lesion/mass effect. Cerebral volume loss and white matter low-density which is mild for age. Vascular: No hyperdense vessel or unexpected calcification. Skull: Normal. Negative for fracture or focal lesion. Sinuses/Orbits: No acute finding. IMPRESSION: No evidence of intracranial injury. Electronically Signed   By: Tiburcio Pea M.D.   On: 02/12/2024 19:42    Scheduled Meds:  aspirin  81 mg Oral Daily   enoxaparin (LOVENOX) injection  40 mg Subcutaneous Q24H   ezetimibe  10 mg Oral Daily   potassium chloride  40 mEq Oral BID   rosuvastatin  40 mg Oral Daily   sodium chloride flush  3 mL Intravenous Q12H    Continuous Infusions:  sodium chloride 125 mL/hr at 02/13/24 1429     LOS: 0 days     Briant Cedar, MD [facility]  If 7PM-7AM, please contact night-coverage [email or web address] 02/13/2024, 3:03 PM

## 2024-02-13 NOTE — Evaluation (Signed)
 Physical Therapy Evaluation and DIscharge Patient Details Name: Carlos [name] MRN: [id number] DOB: [birth year] Today's Date: 02/13/2024  History of Present Illness  Pt is 83 yo male who presents on 02/12/24 with syncope and collapse. Pt with bradycardia and diarrhea. CT head neg. PMH: CAD s/p CABGx5 in 2009, bradycardia, mild-moderate AS, hyperlipidemia  Clinical Impression  Patient evaluated by Physical Therapy with no further acute PT needs identified. All education has been completed and the patient has no further questions. Pt received in bed, continues to have diarrhea, including beginning of PT session. Pt is independent with toileting. Pt steady with transfers and gait. Ambulated 300', normal pattern and pace, HR 68-71 bpm. Educated on transition exercises for orthostatic hypotension as he had positive values with nursing. No dizziness with transitions today though. Will request that mobility team ambulate with pt but no acute PT needs at this time. Of note, his wife does not drive so if he is restricted at d/c because of these episodes he will need family support for this which he says he does have.  See below for any follow-up Physical Therapy or equipment needs. PT is signing off. Thank you for this referral.         If plan is discharge home, recommend the following: Assist for transportation   Can travel by private vehicle        Equipment Recommendations None recommended by PT  Recommendations for Other Services       Functional Status Assessment Patient has not had a recent decline in their functional status     Precautions / Restrictions Precautions Precautions: Fall Recall of Precautions/Restrictions: Intact Restrictions Weight Bearing Restrictions Per Provider Order: No      Mobility  Bed Mobility Overal bed mobility: Modified Independent                  Transfers Overall transfer level: Modified independent Equipment used: None                General transfer comment: steady with sit>stand from bed and toilet    Ambulation/Gait Ambulation/Gait assistance: Contact guard assist Gait Distance (Feet): 300 Feet Assistive device: None Gait Pattern/deviations: WFL(Within Functional Limits) Gait velocity: WFL Gait velocity interpretation: >4.37 ft/sec, indicative of normal walking speed   General Gait Details: steady with ambulation, decreased arm swing. Walks daily at baseline  Acupuncturist Bed    Modified Rankin (Stroke Patients Only)       Balance Overall balance assessment: Mild deficits observed, not formally tested                                           Pertinent Vitals/Pain Pain Assessment Pain Assessment: No/denies pain    Home Living Family/patient expects to be discharged to:: Private residence Living Arrangements: Spouse/significant other Available Help at Discharge: Family;Available 24 hours/day Type of Home: House Home Access: Stairs to enter Entrance Stairs-Rails: Right Entrance Stairs-Number of Steps: 5 Alternate Level Stairs-Number of Steps: flight Home Layout: Two level;Able to live on main level with bedroom/bathroom Home Equipment: Grab bars - tub/shower Additional Comments: lives with wife, she has OA, mobility limited, does not drive    Prior Function Prior Level of Function : Independent/Modified Independent;Driving  Extremity/Trunk Assessment   Upper Extremity Assessment Upper Extremity Assessment: Overall WFL for tasks assessed    Lower Extremity Assessment Lower Extremity Assessment: Overall WFL for tasks assessed    Cervical / Trunk Assessment Cervical / Trunk Assessment: Kyphotic  Communication   Communication Communication: No apparent difficulties    Cognition Arousal: Alert Behavior During Therapy: WFL for tasks assessed/performed   PT - Cognitive impairments: No apparent  impairments                         Following commands: Intact       Cueing Cueing Techniques: Verbal cues     General Comments General comments (skin integrity, edema, etc.): HR 68-71 with gait. Pt continues to have diarrhea.    Exercises     Assessment/Plan    PT Assessment Patient does not need any further PT services  PT Problem List         PT Treatment Interventions      PT Goals (Current goals can be found in the Care Plan section)  Acute Rehab PT Goals Patient Stated Goal: go home PT Goal Formulation: All assessment and education complete, DC therapy    Frequency       Co-evaluation               AM-PAC PT "6 Clicks" Mobility  Outcome Measure Help needed turning from your back to your side while in a flat bed without using bedrails?: None Help needed moving from lying on your back to sitting on the side of a flat bed without using bedrails?: None Help needed moving to and from a bed to a chair (including a wheelchair)?: None Help needed standing up from a chair using your arms (e.g., wheelchair or bedside chair)?: None Help needed to walk in hospital room?: None Help needed climbing 3-5 steps with a railing? : None 6 Click Score: 24    End of Session Equipment Utilized During Treatment: Gait belt Activity Tolerance: Patient tolerated treatment well Patient left: in bed;with call bell/phone within reach;with bed alarm set Nurse Communication: Mobility status PT Visit Diagnosis: Difficulty in walking, not elsewhere classified (R26.2)    Time: 1610-9604 PT Time Calculation (min) (ACUTE ONLY): 31 min   Charges:   PT Evaluation $PT Eval Moderate Complexity: 1 Mod PT Treatments $Gait Training: 8-22 mins PT General Charges $$ ACUTE PT VISIT: 1 Visit         Lyanne Co, PT  Acute Rehab Services Secure chat preferred Office [phone]   Carlos [name] 02/13/2024, 11:33 AM

## 2024-02-13 NOTE — TOC Initial Note (Signed)
 Transition of Care [facility]) - Initial/Assessment Note    Patient Details  Name: Carlos [name] MRN: [id number] Date of Birth: 20-Jun-1941  Transition of Care [facility]) CM/SW Contact:    Carlos Haven, RN Phone Number: 02/13/2024, 1:26 PM  Clinical Narrative:                 From home with spouse, has PCP and insurance on file, states has no HH services in place at this time or DME at home.  States family member will transport them home at [employer] and family is support system, states gets medications from [facility] [address].  Pta self ambulatory.  Patient has given this NCM permission to speak with wife Carlos [name] if needed.   Expected Discharge Plan: Home/Self Care Barriers to Discharge: Continued Medical Work up   Patient Goals and CMS Choice Patient states their goals for this hospitalization and ongoing recovery are:: return home   Choice offered to / list presented to : NA      Expected Discharge Plan and Services In-house Referral: NA Discharge Planning Services: CM Consult Post Acute Care Choice: NA Living arrangements for the past 2 months: Single Family Home                 DME Arranged: N/A         HH Arranged: NA          Prior Living Arrangements/Services Living arrangements for the past 2 months: Single Family Home Lives with:: Spouse Patient language and need for interpreter reviewed:: Yes Do you feel safe going back to the place where you live?: Yes      Need for Family Participation in Patient Care: Yes (Comment) Care giver support system in place?: Yes (comment)   Criminal Activity/Legal Involvement Pertinent to Current Situation/Hospitalization: No - Comment as needed  Activities of Daily Living   ADL Screening (condition at time of admission) Independently performs ADLs?: Yes (appropriate for developmental age) Is the patient deaf or have difficulty hearing?: No Does the patient have difficulty seeing, even when wearing  glasses/contacts?: No Does the patient have difficulty concentrating, remembering, or making decisions?: No  Permission Sought/Granted Permission sought to share information with : Case Manager Permission granted to share information with : Yes, Verbal Permission Granted  Share Information with NAME: Carlos [name]     Permission granted to share info w Relationship: wife     Emotional Assessment Appearance:: Appears stated age Attitude/Demeanor/Rapport: Engaged Affect (typically observed): Appropriate Orientation: : Oriented to Place, Oriented to Self, Oriented to  Time, Oriented to Situation Alcohol / Substance Use: Not Applicable Psych Involvement: No (comment)  Admission diagnosis:  Syncope and collapse [R55] Patient Active Problem List   Diagnosis Date Noted   Syncope and collapse 02/12/2024   S/P CABG x 5 02/12/2024   Aortic stenosis 02/12/2024   Sinus bradycardia on ECG 02/19/2015   Hyperlipidemia with target LDL less than 70 03/05/2014   CAD (coronary artery disease) 03/05/2014   Decreased libido 03/05/2014   S/P tonsillectomy 1945 03/05/2014   PCP:  Alysia Penna, MD Pharmacy:   [id number] [place] [place] - [address] AT [address] OF [facility] [address] [place] [address] Phone: [phone] Fax: [phone]     Social Drivers of Health (SDOH) Social History: SDOH Screenings   Food Insecurity: No Food Insecurity (02/12/2024)  Housing: Low Risk  (02/12/2024)  Transportation Needs: No Transportation Needs (02/12/2024)  Utilities: Not At Risk (02/12/2024)  Social Connections: Socially Integrated (02/12/2024)  Tobacco Use: Medium Risk (02/12/2024)   SDOH Interventions:     Readmission Risk Interventions     No data to display

## 2024-02-13 NOTE — Plan of Care (Signed)
  Problem: Clinical Measurements: Goal: Respiratory complications will improve Outcome: Progressing   Problem: Activity: Goal: Risk for activity intolerance will decrease Outcome: Progressing   Problem: Nutrition: Goal: Adequate nutrition will be maintained Outcome: Progressing   Problem: Coping: Goal: Level of anxiety will decrease Outcome: Progressing   Problem: Pain Managment: Goal: General experience of comfort will improve and/or be controlled Outcome: Progressing

## 2024-02-13 NOTE — Progress Notes (Signed)
 Rounding Note    Patient Name: Carlos [name] Date of Encounter: 02/13/2024  [facility] Cardiologist: Nicki Guadalajara, MD   Subjective   Denies any chest pain, dyspnea, or lightheadedness  Inpatient Medications    Scheduled Meds:  aspirin  81 mg Oral Daily   enoxaparin (LOVENOX) injection  40 mg Subcutaneous Q24H   ezetimibe  10 mg Oral Daily   potassium chloride  40 mEq Oral BID   rosuvastatin  40 mg Oral Daily   sodium chloride flush  3 mL Intravenous Q12H   Continuous Infusions:  sodium chloride 125 mL (02/13/24 0527)   PRN Meds: acetaminophen **OR** acetaminophen, polyethylene glycol   Vital Signs    Vitals:   02/13/24 0354 02/13/24 0355 02/13/24 0356 02/13/24 0715  BP: (!) 108/59 (!) 108/59  (!) 108/59  Pulse: (!) 51 (!) 58 (!) 58 (!) 54  Resp: 16 18 17 18   Temp:  97.9 F (36.6 C)  98.6 F (37 C)  TempSrc:  Oral  Oral  SpO2: 93% 94% 94% 97%  Weight:      Height:        Intake/Output Summary (Last 24 hours) at 02/13/2024 0926 Last data filed at 02/13/2024 0303 Gross per 24 hour  Intake 2019.68 ml  Output --  Net 2019.68 ml      02/13/2024   12:15 AM 02/12/2024   11:22 AM 10/03/2023   10:20 AM  Last 3 Weights  Weight (lbs) 191 lb 187 lb 189 lb 3.2 oz  Weight (kg) 86.637 kg 84.823 kg 85.821 kg      Telemetry    NSR - Personally Reviewed  ECG    No new ECG - Personally Reviewed  Physical Exam   GEN: No acute distress.   Neck: No JVD Cardiac: RRR, no murmurs, rubs, or gallops.  Respiratory: Clear to auscultation bilaterally. GI: Soft, nontender, non-distended  MS: No edema; No deformity. Neuro:  Nonfocal  Psych: Normal affect   Labs    High Sensitivity Troponin:   Recent Labs  Lab 02/12/24 1244 02/12/24 1702  TROPONINIHS 29* 38*     Chemistry Recent Labs  Lab 02/12/24 1131 02/12/24 1244 02/13/24 0305  NA 140  --  139  K 3.9  --  3.1*  CL 108  --  111  CO2 22  --  20*  GLUCOSE 135*  --  113*  BUN 23  --  20   CREATININE 1.26*  --  1.02  CALCIUM 9.0  --  7.7*  MG  --  1.7 1.7  PROT  --  7.2 5.7*  ALBUMIN  --  3.8 3.0*  AST  --  40 37  ALT  --  25 24  ALKPHOS  --  40 33*  BILITOT  --  1.1 0.9  GFRNONAA 56*  --  >60  ANIONGAP 10  --  8    Lipids No results for input(s): "CHOL", "TRIG", "HDL", "LABVLDL", "LDLCALC", "CHOLHDL" in the last 168 hours.  Hematology Recent Labs  Lab 02/12/24 1131 02/13/24 0305  WBC 9.8 5.9  RBC 4.68 4.16*  HGB 14.8 13.0  HCT 44.0 38.6*  MCV 94.0 92.8  MCH 31.6 31.3  MCHC 33.6 33.7  RDW 12.5 12.8  PLT 175 129*   Thyroid  Recent Labs  Lab 02/13/24 0305  TSH 0.558    BNPNo results for input(s): "BNP", "PROBNP" in the last 168 hours.  DDimer No results for input(s): "DDIMER" in the last 168 hours.  Radiology    CT HEAD WO CONTRAST ( ) Result Date: 02/12/2024 CLINICAL DATA:  Head trauma EXAM: CT HEAD WITHOUT CONTRAST TECHNIQUE: Contiguous axial images were obtained from the base of the skull through the vertex without intravenous contrast. RADIATION DOSE REDUCTION: This exam was performed according to the departmental dose-optimization program which includes automated exposure control, adjustment of the mA and/or kV according to patient size and/or use of iterative reconstruction technique. COMPARISON:  03/17/2021 FINDINGS: Brain: No evidence of acute infarction, hemorrhage, hydrocephalus, extra-axial collection or mass lesion/mass effect. Cerebral volume loss and white matter low-density which is mild for age. Vascular: No hyperdense vessel or unexpected calcification. Skull: Normal. Negative for fracture or focal lesion. Sinuses/Orbits: No acute finding. IMPRESSION: No evidence of intracranial injury. Electronically Signed   By: Tiburcio Pea M.D.   On: 02/12/2024 19:42   DG Chest Port 1 View Result Date: 02/12/2024 CLINICAL DATA:  LH? EXAM: PORTABLE CHEST 1 VIEW COMPARISON:  05/25/2008. FINDINGS: Low lung volume. There are probable atelectatic changes  at the lung bases, left more than right. Bilateral lung fields are otherwise clear. Bilateral costophrenic angles are clear. Normal cardio-mediastinal silhouette. There are surgical staples along the heart border and sternotomy wires, status post CABG (coronary artery bypass graft). No acute osseous abnormalities. The soft tissues are within normal limits. IMPRESSION: No active disease. Electronically Signed   By: Jules Schick M.D.   On: 02/12/2024 13:54    Cardiac Studies     Patient Profile     83 y.o. male with a hx of CAD s/p CABGx5 in 2009, bradycardia, mild-moderate AS, hyperlipidemia who is being seen 02/12/2024 for the evaluation of syncope/collaps   Assessment & Plan    Syncope: Reported syncopal episode where he fell and hit head after waking up at 2 AM to get drink of water.  Denied prodromal symptoms.  Noted to have transient A-V dissociation on telemetry in ED (see rhythm strip in Dr. Anne Fu' note from 3/26).  Currently in sinus rhythm -Echocardiogram -EP consult  CAD: Status post CABG with LIMA-LAD, SVG-D2, SVG-OM, SVG-RCA.  Denies any anginal symptoms.  Troponin mildly elevated and flat (29 > 38).  Suspect demand ischemia -Continue aspirin, rosuvastatin, Zetia  Aortic stenosis: Mild to moderate on echocardiogram 06/2023.  Will update echocardiogram  AKI: likely prerenal due to diarrhea.  Resolved with IVF  For questions or updates, please contact [facility] Please consult [email or web address] for contact info under        Signed, Little Ishikawa, MD  02/13/2024, 9:26 AM

## 2024-02-13 NOTE — Consult Note (Signed)
 Cardiology Consultation   Patient ID: Carlos [name] MRN: [id number]; DOB: 12-01-1940  Admit date: 02/12/2024 Date of Consult: 02/13/2024  PCP:  Alysia Penna, MD   [facility] Providers Cardiologist:  Nicki Guadalajara, MD    Patient Profile:   Carlos [name] is a 83 y.o. male with a hx of CAD (CABG 2009), VHD (mild-mod AS), HLD who is being seen 02/13/2024 for the evaluation of syncope and CHB at the request of Dr. Bjorn Pippin.  History of Present Illness:   Carlos [name] last saw Dr. Tresa Endo Nov 2024, walking 2-3 times per week for up to 2 miles each time last TTE July 02, 2023 which continued to show normal LV function with EF 60 to 65%, grade 1 diastolic dysfunction. There is mild to moderate aortic valve stenosis with a mean gradient of 14 mm and peak gradient of 24.6 mm   He was admitted yesterday  Reported a syncopal spell, after having gotten up in the night to get some water, on his way back fainted. Reported having been on the floor for about 15 minutes, though unsure how long he was unconscious for Wife reported felt a slow pulse once he was back in bed, called EMS >> he declined transport Yesterday morning another syncopal episode  yesterday morning during a BM/diarrhea Called PMD and was advised to go to the ER  Cardiology consulted yesterday with observed transient CHB on telemetry with rate 40's, enlisted hx of bradycardia 2022 requiring his BB be stopped Did discuss GI upset and diarrhea, vasovagal episode should be considered, though given reported length of LOC (?), less likely. Planned for EP consult   LABS K+ 3.9 > 3.1 Mag 1.7 BUN/Creat 23/1.26 > 1.02 HS Trop 29 > 38 WBC 9.8 > 5.9 H/H 13/38 Plts 129 TSH 0.558  HOME meds reviewed: no nodal blocking agents  His wife is bedside (a retired Scientist, forensic), they report: He says that he was in bed woke (though wasn't sleeping well)/started to be bothered by what he felt was GERD, got up to get a glass of  water > dod that and while in the bathroom, the urge to have a BM came on suddenly and then he woke on the floor. He does not know how long he was unconscious, did hit he's head, and was bleeding from the back of his head, but did lay on the floor for several minutes just too weak to get up, did though get up and preceded to have a very large and very watery episode of diarrhea and went back to bed, told his wife what happened, she checked his pulse reports it was "every, very slow, at 50bpm, checking it 3 times. Described him as drenched in sweat, and felt hot, but pt reported feeling chilled/cold Called 911 They offered to take him, also reported a "slow" HR, but he was feeling better and declined. Went to bed. When he woke later in the morning called his PMD and was advised to get checked.  He did NOT have a second episode  Here he has not had any further episodes either of any dizziness, near syncope or syncope.  He reports feeling well up to this, walks 2 miles about every 2-3 days and did so the prior morning as usual No CP, palpitations, SOB   He does have fainting history  2-3 years ago had a broken arm, was in the waiting room, as it got more swollen and more painful, as he waited started  to become very upset and got lightheaded and either fainted or nearly did  Many years ago, was in the ER when a friend sliced his foot at the beach, when he was watching them check/clean his friends wound he became near faint/very lightheaded     Past Medical History:  Diagnosis Date   Coronary artery disease 2009   LIMA-LAD, SVG-D1, SVG-OM1-0M2, SVG-PDA    Past Surgical History:  Procedure Laterality Date   Carotid Duplex  05/04/2008   bilateral high bifurcations, no obvious plaque,    COLONOSCOPY WITH PROPOFOL N/A 10/03/2015   Procedure: COLONOSCOPY WITH PROPOFOL;  Surgeon: Charolett Bumpers, MD;  Location: [facility];  Service: Endoscopy;  Laterality: N/A;   CORONARY ARTERY BYPASS  GRAFT  2009   LIMA-LAD, SVG-D1, SVG-OM1-0M2, SVG-PDA   NM MYOCAR PERF EJECTION FRACTION  08/24/2009   Protocol:Bruce, post stress EF 68%, exercise cap , low risk scan   NM MYOCAR PERF WALL MOTION  08/21/2012   protocol:Bruce post stress EF64%, exercise cap . low risk scan   TRANSTHORACIC ECHOCARDIOGRAM  08/24/2009   LV EF55%, normal study   TRANSTHORACIC ECHOCARDIOGRAM  05/03/2008   LV 60% aortic valve thickness mild-moderate increase     Home Medications:  Prior to Admission medications   Medication Sig Start Date End Date Taking? Authorizing Provider  aspirin 81 MG tablet Take 81 mg by mouth daily.   Yes [provider]  Coenzyme Q10 (CO Q-10) 100 MG CAPS Take 100 mg by mouth daily.   Yes [provider]  ezetimibe (ZETIA) 10 MG tablet TAKE 1 TABLET BY MOUTH EVERY DAY 03/04/23  Yes Lennette Bihari, MD  GLUCOSAMINE-CHONDROITIN PO Take 1 tablet by mouth 2 (two) times daily. 1500/1103mg  per patient   Yes [provider]  Multiple Vitamin (MULTIVITAMIN WITH MINERALS) TABS tablet Take 1 tablet by mouth daily.   Yes [provider]  rosuvastatin (CRESTOR) 40 MG tablet TAKE 1 TABLET BY MOUTH EVERY DAY 12/03/23  Yes Lennette Bihari, MD    Inpatient Medications: Scheduled Meds:  aspirin  81 mg Oral Daily   enoxaparin (LOVENOX) injection  40 mg Subcutaneous Q24H   ezetimibe  10 mg Oral Daily   potassium chloride  40 mEq Oral BID   rosuvastatin  40 mg Oral Daily   sodium chloride flush  3 mL Intravenous Q12H   Continuous Infusions:  sodium chloride 125 mL (02/13/24 0527)   PRN Meds: acetaminophen **OR** acetaminophen, perflutren lipid microspheres (DEFINITY) IV suspension, polyethylene glycol  Allergies:    Allergies  Allergen Reactions   Nebivolol     Other Reaction(s): bradycardia   Other Swelling and Other (See Comments)    FLU VACCINE   Pollen Extract     Other Reaction(s): Unknown    Social History:   Social History    Socioeconomic History   Marital status: Married    Spouse name: Not on file   Number of children: Not on file   Years of education: Not on file   Highest education level: Not on file  Occupational History   Not on file  Tobacco Use   Smoking status: Former    Current packs/day: 1.00    Average packs/day: 1 pack/day for 20.0 years (20.0 ttl pk-yrs)    Types: Cigarettes   Smokeless tobacco: Never   Tobacco comments:    quit about 15 years ago  Substance and Sexual Activity   Alcohol use: Yes    Comment: 1 glass of wine daily  Drug use: No   Sexual activity: Not on file  Other Topics Concern   Not on file  Social History Narrative   Not on file   Social Drivers of Health   Financial Resource Strain: Not on file  Food Insecurity: No Food Insecurity (02/12/2024)   Hunger Vital Sign    Worried About Running Out of Food in the Last Year: Never true    Ran Out of Food in the Last Year: Never true  Transportation Needs: No Transportation Needs (02/12/2024)   PRAPARE - Administrator, Civil Service (Medical): No    Lack of Transportation (Non-Medical): No  Physical Activity: Not on file  Stress: Not on file  Social Connections: Socially Integrated (02/12/2024)   Social Connection and Isolation Panel [NHANES]    Frequency of Communication with Friends and Family: More than three times a week    Frequency of Social Gatherings with Friends and Family: Once a week    Attends Religious Services: More than 4 times per year    Active Member of [employer] or Organizations: No    Attends Engineer, structural: 1 to 4 times per year    Marital Status: Married  Catering manager Violence: Not At Risk (02/12/2024)   Humiliation, Afraid, Rape, and Kick questionnaire    Fear of Current or Ex-Partner: No    Emotionally Abused: No    Physically Abused: No    Sexually Abused: No    Family History:   Family History  Problem Relation Age of Onset   Heart attack Maternal  Grandfather      ROS:  Please see the history of present illness.  All other ROS reviewed and negative.     Physical Exam/Data:   Vitals:   02/13/24 0354 02/13/24 0355 02/13/24 0356 02/13/24 0715  BP: (!) 108/59 (!) 108/59  (!) 108/59  Pulse: (!) 51 (!) 58 (!) 58 (!) 54  Resp: 16 18 17 18   Temp:  97.9 F (36.6 C)  98.6 F (37 C)  TempSrc:  Oral  Oral  SpO2: 93% 94% 94% 97%  Weight:      Height:        Intake/Output Summary (Last 24 hours) at 02/13/2024 1154 Last data filed at 02/13/2024 0303 Gross per 24 hour  Intake 2019.68 ml  Output --  Net 2019.68 ml      02/13/2024   12:15 AM 02/12/2024   11:22 AM 10/03/2023   10:20 AM  Last 3 Weights  Weight (lbs) 191 lb 187 lb 189 lb 3.2 oz  Weight (kg) 86.637 kg 84.823 kg 85.821 kg     Body mass index is 29.04 kg/m.  General:  Well nourished, well developed, in no acute distress HEENT: no ongoing bleeding posterior head Neck: no JVD Vascular: No carotid bruits Cardiac:  RRR; 2-3/6 SM Lungs:  CTA b/l, no wheezing, rhonchi or rales  Abd: soft, nontender Ext: no edema Musculoskeletal:  No deformities Skin: warm and dry  Neuro:  no focal abnormalities noted Psych:  Normal affect   EKG:  The EKG was personally reviewed and demonstrates:    SR 65bpm, rSr in V1, normal intervals  SR 72bpm, normal intervals  OLD 10/03/23: SB 58bpm, PR   Telemetry:  Telemetry was personally reviewed and demonstrates:    SB 50's I find 4 episodes of block/slowing All have sinus slowing associated with them All very short 2.4 and 2.8 second pauses longest associated with dropped beat One is without  AV block > transient SB alone (2 beats into 35bpm or so  No prolonged significant bradycardia or pausing    Relevant CV Studies:  02/13/24: TTE 1. Left ventricular ejection fraction, by estimation, is 60 to 65%. The  left ventricle has normal function. The left ventricle has no regional  wall motion abnormalities. Left  ventricular diastolic parameters are  consistent with Grade II diastolic  dysfunction (pseudonormalization).   2. Right ventricular systolic function is normal. The right ventricular  size is normal.   3. The mitral valve is normal in structure. No evidence of mitral valve  regurgitation. No evidence of mitral stenosis. Moderate mitral annular  calcification.   4. The aortic valve is normal in structure. There is moderate  calcification of the aortic valve. There is moderate thickening of the  aortic valve. Aortic valve regurgitation is not visualized. Moderate  aortic valve stenosis. Aortic valve area, by VTI  measures 1.88 cm. Aortic valve mean gradient measures 20.0 mmHg. Aortic  valve Vmax measures 2.81 m/s.   5. Aortic dilatation noted. There is mild dilatation of the aortic root,  measuring 40 mm.   6. The inferior vena cava is normal in size with greater than 50%  respiratory variability, suggesting right atrial pressure of 3 mmHg.    ECHO: 07/02/2023  1. Left ventricular ejection fraction, by estimation, is 60 to 65%. The  left ventricle has normal function. The left ventricle has no regional  wall motion abnormalities. There is mild left ventricular hypertrophy.  Left ventricular diastolic parameters  are consistent with Grade I diastolic dysfunction (impaired relaxation).  The average left ventricular global longitudinal strain is -20.3 %. The  global longitudinal strain is normal.   2. Right ventricular systolic function is normal. The right ventricular  size is normal.   3. The mitral valve is normal in structure. No evidence of mitral valve  regurgitation. No evidence of mitral stenosis. Moderate mitral annular  calcification.   4. The aortic valve is tricuspid. There is moderate calcification of the  aortic valve. There is moderate thickening of the aortic valve. Aortic  valve regurgitation is not visualized. Mild to moderate aortic valve  stenosis. Aortic valve mean  gradient  measures 14.0 mmHg. Aortic valve Vmax measures 2.48 m/s.   5. The inferior vena cava is normal in size with greater than 50%  respiratory variability, suggesting right atrial pressure of 3 mmHg.   Laboratory Data:  High Sensitivity Troponin:   Recent Labs  Lab 02/12/24 1244 02/12/24 1702  TROPONINIHS 29* 38*     Chemistry Recent Labs  Lab 02/12/24 1131 02/12/24 1244 02/13/24 0305  NA 140  --  139  K 3.9  --  3.1*  CL 108  --  111  CO2 22  --  20*  GLUCOSE 135*  --  113*  BUN 23  --  20  CREATININE 1.26*  --  1.02  CALCIUM 9.0  --  7.7*  MG  --  1.7 1.7  GFRNONAA 56*  --  >60  ANIONGAP 10  --  8    Recent Labs  Lab 02/12/24 1244 02/13/24 0305  PROT 7.2 5.7*  ALBUMIN 3.8 3.0*  AST 40 37  ALT 25 24  ALKPHOS 40 33*  BILITOT 1.1 0.9   Lipids No results for input(s): "CHOL", "TRIG", "HDL", "LABVLDL", "LDLCALC", "CHOLHDL" in the last 168 hours.  Hematology Recent Labs  Lab 02/12/24 1131 02/13/24 0305  WBC 9.8 5.9  RBC 4.68 4.16*  HGB 14.8 13.0  HCT 44.0 38.6*  MCV 94.0 92.8  MCH 31.6 31.3  MCHC 33.6 33.7  RDW 12.5 12.8  PLT 175 129*   Thyroid  Recent Labs  Lab 02/13/24 0305  TSH 0.558    BNPNo results for input(s): "BNP", "PROBNP" in the last 168 hours.  DDimer No results for input(s): "DDIMER" in the last 168 hours.   Radiology/Studies:   CT HEAD WO CONTRAST ( ) Result Date: 02/12/2024 CLINICAL DATA:  Head trauma EXAM: CT HEAD WITHOUT CONTRAST TECHNIQUE: Contiguous axial images were obtained from the base of the skull through the vertex without intravenous contrast. RADIATION DOSE REDUCTION: This exam was performed according to the departmental dose-optimization program which includes automated exposure control, adjustment of the mA and/or kV according to patient size and/or use of iterative reconstruction technique. COMPARISON:  03/17/2021 FINDINGS: Brain: No evidence of acute infarction, hemorrhage, hydrocephalus, extra-axial collection  or mass lesion/mass effect. Cerebral volume loss and white matter low-density which is mild for age. Vascular: No hyperdense vessel or unexpected calcification. Skull: Normal. Negative for fracture or focal lesion. Sinuses/Orbits: No acute finding. IMPRESSION: No evidence of intracranial injury. Electronically Signed   By: Tiburcio Pea M.D.   On: 02/12/2024 19:42   DG Chest Port 1 View Result Date: 02/12/2024 CLINICAL DATA:  LH? EXAM: PORTABLE CHEST 1 VIEW COMPARISON:  05/25/2008. FINDINGS: Low lung volume. There are probable atelectatic changes at the lung bases, left more than right. Bilateral lung fields are otherwise clear. Bilateral costophrenic angles are clear. Normal cardio-mediastinal silhouette. There are surgical staples along the heart border and sternotomy wires, status post CABG (coronary artery bypass graft). No acute osseous abnormalities. The soft tissues are within normal limits. IMPRESSION: No active disease. Electronically Signed   By: Jules Schick M.D.   On: 02/12/2024 13:54     Assessment and Plan:   Syncope Sounds of vagal event (which he has had before) Telemetry reviewed Also suggests vagal with sinus slowing ahead of a rare, single dropped beat, as well as PR prolongation c/w Mobitz one on strip noted in Cardiology consult (I was unable track down on tele) No prolonged or significant bradycardia He reports his baseline HR 50's  He hash had several recurrent watery diarrhea episodes here >> pending Cdiff test (just sent)  I do not think he needs pacing  Dr. Lalla Brothers will see once out of case, for final recommendations OK to eat  2. SM on exam c/w AS Described as moderate on his echo Known for him Follows with Dr. Tresa Endo   Risk Assessment/Risk Scores:    For questions or updates, please contact [facility] Please consult [email or web address] for contact info under    Signed, Sheilah Pigeon, PA-C  02/13/2024 11:54 AM

## 2024-02-14 DIAGNOSIS — Z7982 Long term (current) use of aspirin: Secondary | ICD-10-CM | POA: Diagnosis not present

## 2024-02-14 DIAGNOSIS — I442 Atrioventricular block, complete: Secondary | ICD-10-CM | POA: Diagnosis present

## 2024-02-14 DIAGNOSIS — Z79899 Other long term (current) drug therapy: Secondary | ICD-10-CM | POA: Diagnosis not present

## 2024-02-14 DIAGNOSIS — Z8249 Family history of ischemic heart disease and other diseases of the circulatory system: Secondary | ICD-10-CM | POA: Diagnosis not present

## 2024-02-14 DIAGNOSIS — Z888 Allergy status to other drugs, medicaments and biological substances status: Secondary | ICD-10-CM | POA: Diagnosis not present

## 2024-02-14 DIAGNOSIS — Z887 Allergy status to serum and vaccine status: Secondary | ICD-10-CM | POA: Diagnosis not present

## 2024-02-14 DIAGNOSIS — R55 Syncope and collapse: Secondary | ICD-10-CM | POA: Diagnosis present

## 2024-02-14 DIAGNOSIS — E869 Volume depletion, unspecified: Secondary | ICD-10-CM | POA: Diagnosis present

## 2024-02-14 DIAGNOSIS — I350 Nonrheumatic aortic (valve) stenosis: Secondary | ICD-10-CM | POA: Diagnosis present

## 2024-02-14 DIAGNOSIS — K30 Functional dyspepsia: Secondary | ICD-10-CM | POA: Diagnosis present

## 2024-02-14 DIAGNOSIS — Z951 Presence of aortocoronary bypass graft: Secondary | ICD-10-CM | POA: Diagnosis not present

## 2024-02-14 DIAGNOSIS — W19XXXA Unspecified fall, initial encounter: Secondary | ICD-10-CM | POA: Diagnosis present

## 2024-02-14 DIAGNOSIS — R197 Diarrhea, unspecified: Secondary | ICD-10-CM | POA: Diagnosis present

## 2024-02-14 DIAGNOSIS — E876 Hypokalemia: Secondary | ICD-10-CM | POA: Diagnosis present

## 2024-02-14 DIAGNOSIS — E785 Hyperlipidemia, unspecified: Secondary | ICD-10-CM | POA: Diagnosis present

## 2024-02-14 DIAGNOSIS — R001 Bradycardia, unspecified: Secondary | ICD-10-CM | POA: Diagnosis present

## 2024-02-14 DIAGNOSIS — F17210 Nicotine dependence, cigarettes, uncomplicated: Secondary | ICD-10-CM | POA: Diagnosis present

## 2024-02-14 DIAGNOSIS — I4589 Other specified conduction disorders: Secondary | ICD-10-CM | POA: Diagnosis present

## 2024-02-14 DIAGNOSIS — S0081XA Abrasion of other part of head, initial encounter: Secondary | ICD-10-CM | POA: Diagnosis present

## 2024-02-14 DIAGNOSIS — I2510 Atherosclerotic heart disease of native coronary artery without angina pectoris: Secondary | ICD-10-CM | POA: Diagnosis present

## 2024-02-14 DIAGNOSIS — N179 Acute kidney failure, unspecified: Secondary | ICD-10-CM | POA: Diagnosis present

## 2024-02-14 DIAGNOSIS — I951 Orthostatic hypotension: Secondary | ICD-10-CM | POA: Diagnosis present

## 2024-02-14 LAB — GASTROINTESTINAL PANEL BY PCR, STOOL (REPLACES STOOL CULTURE)

## 2024-02-14 LAB — BASIC METABOLIC PANEL WITH GFR
Anion gap: 5 (ref 5–15)
BUN: 12 mg/dL (ref 8–23)
CO2: 22 mmol/L (ref 22–32)
Calcium: 7.4 mg/dL — ABNORMAL LOW (ref 8.9–10.3)
Chloride: 112 mmol/L — ABNORMAL HIGH (ref 98–111)
Creatinine, Ser: 1.02 mg/dL (ref 0.61–1.24)
GFR, Estimated: >60 mL/min (ref >60)
Glucose, Bld: 95 mg/dL (ref 70–99)
Potassium: 3.7 mmol/L (ref 3.5–5.1)
Sodium: 139 mmol/L (ref 135–145)

## 2024-02-14 LAB — CBC WITH DIFFERENTIAL/PLATELET
Abs Immature Granulocytes: 0.02 10*3/uL (ref 0.00–0.07)
Basophils Absolute: 0 10*3/uL (ref 0.0–0.1)
Basophils Relative: 0 %
Eosinophils Absolute: 0.1 10*3/uL (ref 0.0–0.5)
Eosinophils Relative: 1 %
HCT: 37.3 % — ABNORMAL LOW (ref 39.0–52.0)
Hemoglobin: 12.8 g/dL — ABNORMAL LOW (ref 13.0–17.0)
Immature Granulocytes: 0 %
Lymphocytes Relative: 17 %
Lymphs Abs: 1.2 10*3/uL (ref 0.7–4.0)
MCH: 32.2 pg (ref 26.0–34.0)
MCHC: 34.3 g/dL (ref 30.0–36.0)
MCV: 93.7 fL (ref 80.0–100.0)
Monocytes Absolute: 1 10*3/uL (ref 0.1–1.0)
Monocytes Relative: 15 %
Neutro Abs: 4.7 10*3/uL (ref 1.7–7.7)
Neutrophils Relative %: 67 %
Platelets: 120 10*3/uL — ABNORMAL LOW (ref 150–400)
RBC: 3.98 MIL/uL — ABNORMAL LOW (ref 4.22–5.81)
RDW: 12.8 % (ref 11.5–15.5)
WBC: 7 10*3/uL (ref 4.0–10.5)
nRBC: 0 % (ref 0.0–0.2)

## 2024-02-14 LAB — GLUCOSE, CAPILLARY: Glucose-Capillary: 84 mg/dL (ref 70–99)

## 2024-02-14 NOTE — Discharge Summary (Signed)
 Physician Discharge Summary  Patient ID: Carlos [name] MRN: [id number] DOB/AGE: [birth year] 85 y.o.  Admit date: 02/12/2024 Discharge date: 02/14/2024  Admission Diagnoses:  Discharge Diagnoses:  Principal Problem:   Syncope and collapse Active Problems:   Hyperlipidemia with target LDL less than 70   CAD (coronary artery disease)   S/P CABG x 5   Aortic stenosis   Syncope   Discharged Condition: stable  Hospital Course: Patient is an 83 year old male with past medical history significant for bradycardia, aortic stenosis, hyperlipidemia, CAD, status post CABG x 5 presenting after episode of syncope.  Patient was admitted with syncope, bradycardia, diarrhea, hypokalemia, and orthostatic hypotension.  Apparently, patient has had recurrent syncope and near syncope, thought to be vasovagal.  Patient was admitted for further workup and management.  Electrophysiology and cardiology teams were consulted to assist with patient's management.  Cardiology and electrophysiology teams have cleared patient for discharge.  Patient will follow-up primary care provider, cardiology and electrophysiology team on discharge.  Syncope: Bradycardia: Orthostatic hypotension:  -Likely vasovagal and/or multi-factorial. -Patient had diarrhea, with associated volume depletion and orthostatic hypotension. -Diarrhea was managed supportively. -Rule out cardiac etiology given transient CHB was noted with heart rate in the 40s on telemetry -Troponin with flat trend 29-->38 -Echo with EF of 60 to 65%, no regional wall motion abnormality, grade 2 diastolic dysfunction, moderate aortic valve stenosis -Cardiology/EP consulted, appreciate recommendations (see below) -CT head unremarkable for any acute intracranial abnormality -Patient has been adequately hydrated.   -Electrophysiology team to advise when patient can resume driving.  Diarrhea -Noted watery/loose stools, denies any nausea/vomiting, abdominal  pain -GI panel revealed norovirus.  Supportive management of the norovirus. -Patient has been adequately hydrated    Hypokalemia Monitored and replaced.     Moderate aortic stenosis Echo as above, remains unchanged from previous   History of CAD S/p CABG x 5 Continue home aspirin, Zetia, rosuvastatin   Consults:  cardiology  Electrophysiology consult:  "#Syncope Based on his history and workup thus far I believe his episode of syncope is consistent with vasovagal syncope.  No evidence of significant conduction system disease.  He should avoid triggers.  He should maintain adequate hydration.  If he has any prodromal symptoms he should immediately get to the ground.  He should avoid driving for the next 4 to 6 weeks.  When he sees Renee in clinic, if remains stable without repeat syncopal episode, okay to return to driving. No permanent pacemaker is indicated at this time".  Significant Diagnostic Studies:  Echo revealed: Left Ventricle: Left ventricular ejection fraction, by estimation, is 60  to 65%. The left ventricle has normal function. The left ventricle has no  regional wall motion abnormalities. Definity contrast agent was given IV  to delineate the left ventricular   endocardial borders. The left ventricular internal cavity size was normal  in size. There is no left ventricular hypertrophy. Left ventricular  diastolic parameters are consistent with Grade II diastolic dysfunction  (pseudonormalization).   Right Ventricle: The right ventricular size is normal. No increase in  right ventricular wall thickness. Right ventricular systolic function is  normal.   Left Atrium: Left atrial size was normal in size.   Right Atrium: Right atrial size was normal in size.   Pericardium: There is no evidence of pericardial effusion.   Mitral Valve: The mitral valve is normal in structure. Moderate mitral  annular calcification. No evidence of mitral valve regurgitation. No   evidence of mitral valve stenosis. MV peak  gradient, 6.2 mmHg. The mean  mitral valve gradient is 2.0 mmHg.   Tricuspid Valve: The tricuspid valve is normal in structure. Tricuspid  valve regurgitation is mild . No evidence of tricuspid stenosis.   Aortic Valve: The aortic valve is normal in structure. There is moderate  calcification of the aortic valve. There is moderate thickening of the  aortic valve. Aortic valve regurgitation is not visualized. Moderate  aortic stenosis is present. Aortic valve  mean gradient measures 20.0 mmHg. Aortic valve peak gradient measures 31.6  mmHg. Aortic valve area, by VTI measures 1.88 cm.   Pulmonic Valve: The pulmonic valve was normal in structure. Pulmonic valve  regurgitation is not visualized. No evidence of pulmonic stenosis.   Aorta: Aortic dilatation noted. There is mild dilatation of the aortic  root, measuring 40 mm.   Venous: The inferior vena cava is normal in size with greater than 50%  respiratory variability, suggesting right atrial pressure of 3 mmHg.   IAS/Shunts: No atrial level shunt detected by color flow Doppler.      CT HEAD WITHOUT CONTRAST:   TECHNIQUE: Contiguous axial images were obtained from the base of the skull through the vertex without intravenous contrast.   RADIATION DOSE REDUCTION: This exam was performed according to the departmental dose-optimization program which includes automated exposure control, adjustment of the mA and/or kV according to patient size and/or use of iterative reconstruction technique.   COMPARISON:  03/17/2021   FINDINGS: Brain: No evidence of acute infarction, hemorrhage, hydrocephalus, extra-axial collection or mass lesion/mass effect. Cerebral volume loss and white matter low-density which is mild for age.   Vascular: No hyperdense vessel or unexpected calcification.   Skull: Normal. Negative for fracture or focal lesion.   Sinuses/Orbits: No acute finding.    IMPRESSION: No evidence of intracranial injury.     Electronically Signed   By: Tiburcio Pea M.D.   On: 02/12/2024 19:42    Discharge Exam: Blood pressure 131/63, pulse (!) 53, temperature 98.1 F (36.7 C), temperature source Oral, resp. rate 18, height 5\' 8"  (1.727 m), weight 84.1 kg, SpO2 99%.   Disposition: Discharge disposition: 01-Home or Self Care       Discharge Instructions     Diet - low sodium heart healthy   Complete by: As directed    Increase activity slowly   Complete by: As directed       Allergies as of 02/14/2024       Reactions   Nebivolol    Other Reaction(s): bradycardia   Other Swelling, Other (See Comments)   FLU VACCINE   Pollen Extract    Other Reaction(s): Unknown        Medication List     STOP taking these medications    Co Q-10 100 MG Caps   GLUCOSAMINE-CHONDROITIN PO       TAKE these medications    aspirin 81 MG tablet Take 81 mg by mouth daily.   ezetimibe 10 MG tablet Commonly known as: ZETIA TAKE 1 TABLET BY MOUTH EVERY DAY   multivitamin with minerals Tabs tablet Take 1 tablet by mouth daily.   rosuvastatin 40 MG tablet Commonly known as: CRESTOR TAKE 1 TABLET BY MOUTH EVERY DAY       Time spent: 35 minutes.  SignedBarnetta Chapel 02/14/2024, 1:48 PM

## 2024-02-14 NOTE — Plan of Care (Signed)
  Problem: Education: Goal: Knowledge of condition and prescribed therapy will improve Outcome: Progressing   Problem: Cardiac: Goal: Will achieve and/or maintain adequate cardiac output Outcome: Progressing   Problem: Education: Goal: Knowledge of General Education information will improve Description: Including pain rating scale, medication(s)/side effects and non-pharmacologic comfort measures Outcome: Progressing   Problem: Health Behavior/Discharge Planning: Goal: Ability to manage health-related needs will improve Outcome: Progressing   Problem: Clinical Measurements: Goal: Respiratory complications will improve Outcome: Progressing   Problem: Activity: Goal: Risk for activity intolerance will decrease Outcome: Progressing   Problem: Nutrition: Goal: Adequate nutrition will be maintained Outcome: Progressing   Problem: Elimination: Goal: Will not experience complications related to urinary retention Outcome: Progressing   Problem: Pain Managment: Goal: General experience of comfort will improve and/or be controlled Outcome: Progressing   Problem: Elimination: Goal: Will not experience complications related to bowel motility Outcome: Not Progressing

## 2024-02-14 NOTE — Plan of Care (Signed)
  Problem: Education: Goal: Knowledge of condition and prescribed therapy will improve Outcome: Progressing   Problem: Cardiac: Goal: Will achieve and/or maintain adequate cardiac output Outcome: Progressing   Problem: Physical Regulation: Goal: Complications related to the disease process, condition or treatment will be avoided or minimized Outcome: Progressing   Problem: Clinical Measurements: Goal: Cardiovascular complication will be avoided Outcome: Progressing   Problem: Elimination: Goal: Will not experience complications related to bowel motility Outcome: Progressing   Problem: Safety: Goal: Ability to remain free from injury will improve Outcome: Progressing

## 2024-02-14 NOTE — Progress Notes (Signed)
 Rounding Note    Patient Name: Carlos [name] Date of Encounter: 02/14/2024  [facility] Cardiologist: Nicki Guadalajara, MD   Subjective   Denies any chest pain, dyspnea, or lightheadedness  Inpatient Medications    Scheduled Meds:  aspirin  81 mg Oral Daily   enoxaparin (LOVENOX) injection  40 mg Subcutaneous Q24H   ezetimibe  10 mg Oral Daily   potassium chloride  40 mEq Oral BID   rosuvastatin  40 mg Oral Daily   sodium chloride flush  3 mL Intravenous Q12H   Continuous Infusions:  sodium chloride 100 mL/hr at 02/14/24 0032   PRN Meds: acetaminophen **OR** acetaminophen, polyethylene glycol   Vital Signs    Vitals:   02/14/24 0217 02/14/24 0321 02/14/24 0451 02/14/24 0805  BP:  118/64    Pulse:  (!) 58    Resp:  18    Temp:  98.2 F (36.8 C)  98 F (36.7 C)  TempSrc:  Oral  Oral  SpO2:  96%    Weight: 87.8 kg  84.1 kg   Height:        Intake/Output Summary (Last 24 hours) at 02/14/2024 0855 Last data filed at 02/14/2024 0804 Gross per 24 hour  Intake 3468.57 ml  Output 775 ml  Net 2693.57 ml      02/14/2024    4:51 AM 02/14/2024    2:17 AM 02/13/2024   12:15 AM  Last 3 Weights  Weight (lbs) 185 lb 8 oz 193 lb 8 oz 191 lb  Weight (kg) 84.142 kg 87.771 kg 86.637 kg      Telemetry    NSR - Personally Reviewed  ECG    No new ECG - Personally Reviewed  Physical Exam   GEN: No acute distress.   Neck: No JVD Cardiac: RRR, no murmurs, rubs, or gallops.  Respiratory: Clear to auscultation bilaterally. GI: Soft, nontender, non-distended  MS: No edema; No deformity. Neuro:  Nonfocal  Psych: Normal affect   Labs    High Sensitivity Troponin:   Recent Labs  Lab 02/12/24 1244 02/12/24 1702  TROPONINIHS 29* 38*     Chemistry Recent Labs  Lab 02/12/24 1131 02/12/24 1244 02/13/24 0305 02/14/24 0244  NA 140  --  139 139  K 3.9  --  3.1* 3.7  CL 108  --  111 112*  CO2 22  --  20* 22  GLUCOSE 135*  --  113* 95  BUN 23  --  20  12  CREATININE 1.26*  --  1.02 1.02  CALCIUM 9.0  --  7.7* 7.4*  MG  --  1.7 1.7  --   PROT  --  7.2 5.7*  --   ALBUMIN  --  3.8 3.0*  --   AST  --  40 37  --   ALT  --  25 24  --   ALKPHOS  --  40 33*  --   BILITOT  --  1.1 0.9  --   GFRNONAA 56*  --  >60 >60  ANIONGAP 10  --  8 5    Lipids No results for input(s): "CHOL", "TRIG", "HDL", "LABVLDL", "LDLCALC", "CHOLHDL" in the last 168 hours.  Hematology Recent Labs  Lab 02/12/24 1131 02/13/24 0305 02/14/24 0244  WBC 9.8 5.9 7.0  RBC 4.68 4.16* 3.98*  HGB 14.8 13.0 12.8*  HCT 44.0 38.6* 37.3*  MCV 94.0 92.8 93.7  MCH 31.6 31.3 32.2  MCHC 33.6 33.7 34.3  RDW 12.5  12.8 12.8  PLT 175 129* 120*   Thyroid  Recent Labs  Lab 02/13/24 0305  TSH 0.558    BNPNo results for input(s): "BNP", "PROBNP" in the last 168 hours.  DDimer No results for input(s): "DDIMER" in the last 168 hours.   Radiology    ECHOCARDIOGRAM COMPLETE Result Date: 02/13/2024    ECHOCARDIOGRAM REPORT   Patient Name:   Carlos [name] [facility] Date of Exam: 02/13/2024 Medical Rec #:  [id number]        Height:       68.0 in Accession #:    [id number]       Weight:       191.0 lb Date of Birth:  [birth year]        BSA:          2.004 m Patient Age:    82 years         BP:           108/59 mmHg Patient Gender: M                HR:           54 bpm. Exam Location:  Inpatient Procedure: 2D Echo, Cardiac Doppler, Color Doppler and Intracardiac            Opacification Agent (Both Spectral and Color Flow Doppler were            utilized during procedure). Indications:    Syncope  History:        Patient has prior history of Echocardiogram examinations, most                 recent 07/02/2023. CAD, Prior CABG; Aortic Valve Disease.  Sonographer:    Amy Chionchio Referring Phys: [id number] Cecille Po MELVIN IMPRESSIONS  1. Left ventricular ejection fraction, by estimation, is 60 to 65%. The left ventricle has normal function. The left ventricle has no regional wall motion abnormalities.  Left ventricular diastolic parameters are consistent with Grade II diastolic dysfunction (pseudonormalization).  2. Right ventricular systolic function is normal. The right ventricular size is normal.  3. The mitral valve is normal in structure. No evidence of mitral valve regurgitation. No evidence of mitral stenosis. Moderate mitral annular calcification.  4. The aortic valve is normal in structure. There is moderate calcification of the aortic valve. There is moderate thickening of the aortic valve. Aortic valve regurgitation is not visualized. Moderate aortic valve stenosis. Aortic valve area, by VTI measures 1.88 cm. Aortic valve mean gradient measures 20.0 mmHg. Aortic valve Vmax measures 2.81 m/s.  5. Aortic dilatation noted. There is mild dilatation of the aortic root, measuring 40 mm.  6. The inferior vena cava is normal in size with greater than 50% respiratory variability, suggesting right atrial pressure of 3 mmHg. FINDINGS  Left Ventricle: Left ventricular ejection fraction, by estimation, is 60 to 65%. The left ventricle has normal function. The left ventricle has no regional wall motion abnormalities. Definity contrast agent was given IV to delineate the left ventricular  endocardial borders. The left ventricular internal cavity size was normal in size. There is no left ventricular hypertrophy. Left ventricular diastolic parameters are consistent with Grade II diastolic dysfunction (pseudonormalization). Right Ventricle: The right ventricular size is normal. No increase in right ventricular wall thickness. Right ventricular systolic function is normal. Left Atrium: Left atrial size was normal in size. Right Atrium: Right atrial size was normal in size. Pericardium: There is no evidence of pericardial effusion. Mitral Valve:  The mitral valve is normal in structure. Moderate mitral annular calcification. No evidence of mitral valve regurgitation. No evidence of mitral valve stenosis. MV peak gradient,  6.2 mmHg. The mean mitral valve gradient is 2.0 mmHg. Tricuspid Valve: The tricuspid valve is normal in structure. Tricuspid valve regurgitation is mild . No evidence of tricuspid stenosis. Aortic Valve: The aortic valve is normal in structure. There is moderate calcification of the aortic valve. There is moderate thickening of the aortic valve. Aortic valve regurgitation is not visualized. Moderate aortic stenosis is present. Aortic valve mean gradient measures 20.0 mmHg. Aortic valve peak gradient measures 31.6 mmHg. Aortic valve area, by VTI measures 1.88 cm. Pulmonic Valve: The pulmonic valve was normal in structure. Pulmonic valve regurgitation is not visualized. No evidence of pulmonic stenosis. Aorta: Aortic dilatation noted. There is mild dilatation of the aortic root, measuring 40 mm. Venous: The inferior vena cava is normal in size with greater than 50% respiratory variability, suggesting right atrial pressure of 3 mmHg. IAS/Shunts: No atrial level shunt detected by color flow Doppler.  LEFT VENTRICLE PLAX 2D LVIDd:         4.20 cm     Diastology LVIDs:         2.80 cm     LV e' medial:    6.31 cm/s LV PW:         1.00 cm     LV E/e' medial:  19.5 LV IVS:        1.10 cm     LV e' lateral:   6.64 cm/s LVOT diam:     2.10 cm     LV E/e' lateral: 18.5 LV SV:         123 LV SV Index:   61 LVOT Area:     3.46 cm  LV Volumes (MOD) LV vol d, MOD A2C: 84.6 ml LV vol d, MOD A4C: 99.7 ml LV vol s, MOD A2C: 32.3 ml LV vol s, MOD A4C: 39.4 ml LV SV MOD A2C:     52.3 ml LV SV MOD A4C:     99.7 ml LV SV MOD BP:      56.7 ml RIGHT VENTRICLE          IVC RV Basal diam:  3.10 cm  IVC diam: 2.40 cm TAPSE (M-mode): 1.3 cm LEFT ATRIUM             Index        RIGHT ATRIUM          Index LA Vol (A2C):   62.2 ml 31.04 ml/m  RA Area:     9.23 cm LA Vol (A4C):   67.2 ml 33.53 ml/m  RA Volume:   15.40 ml 7.68 ml/m LA Biplane Vol: 64.3 ml 32.08 ml/m  AORTIC VALVE                     PULMONIC VALVE AV Area (Vmax):    1.71 cm       PV Vmax:       0.93 m/s AV Area (Vmean):   1.67 cm      PV Peak grad:  3.4 mmHg AV Area (VTI):     1.88 cm AV Vmax:           281.00 cm/s AV Vmean:          213.333 cm/s AV VTI:            0.653 m AV Peak Grad:  31.6 mmHg AV Mean Grad:      20.0 mmHg LVOT Vmax:         139.00 cm/s LVOT Vmean:        103.000 cm/s LVOT VTI:          0.354 m LVOT/AV VTI ratio: 0.54  AORTA Ao Root diam: 4.00 cm Ao Asc diam:  3.60 cm MITRAL VALVE                TRICUSPID VALVE MV Area (PHT): 1.89 cm     TR Peak grad:   24.0 mmHg MV Area VTI:   2.41 cm     TR Vmax:        245.00 cm/s MV Peak grad:  6.2 mmHg MV Mean grad:  2.0 mmHg     SHUNTS MV Vmax:       1.25 m/s     Systemic VTI:  0.35 m MV Vmean:      70.8 cm/s    Systemic Diam: 2.10 cm MV Decel Time: 401 msec MV E velocity: 123.00 cm/s MV A velocity: 107.00 cm/s MV E/A ratio:  1.15 Donato Schultz MD Electronically signed by Donato Schultz MD Signature Date/Time: 02/13/2024/11:16:42 AM    Final    CT HEAD WO CONTRAST ( ) Result Date: 02/12/2024 CLINICAL DATA:  Head trauma EXAM: CT HEAD WITHOUT CONTRAST TECHNIQUE: Contiguous axial images were obtained from the base of the skull through the vertex without intravenous contrast. RADIATION DOSE REDUCTION: This exam was performed according to the departmental dose-optimization program which includes automated exposure control, adjustment of the mA and/or kV according to patient size and/or use of iterative reconstruction technique. COMPARISON:  03/17/2021 FINDINGS: Brain: No evidence of acute infarction, hemorrhage, hydrocephalus, extra-axial collection or mass lesion/mass effect. Cerebral volume loss and white matter low-density which is mild for age. Vascular: No hyperdense vessel or unexpected calcification. Skull: Normal. Negative for fracture or focal lesion. Sinuses/Orbits: No acute finding. IMPRESSION: No evidence of intracranial injury. Electronically Signed   By: Tiburcio Pea M.D.   On: 02/12/2024 19:42   DG Chest  Port 1 View Result Date: 02/12/2024 CLINICAL DATA:  LH? EXAM: PORTABLE CHEST 1 VIEW COMPARISON:  05/25/2008. FINDINGS: Low lung volume. There are probable atelectatic changes at the lung bases, left more than right. Bilateral lung fields are otherwise clear. Bilateral costophrenic angles are clear. Normal cardio-mediastinal silhouette. There are surgical staples along the heart border and sternotomy wires, status post CABG (coronary artery bypass graft). No acute osseous abnormalities. The soft tissues are within normal limits. IMPRESSION: No active disease. Electronically Signed   By: Jules Schick M.D.   On: 02/12/2024 13:54    Cardiac Studies     Patient Profile     83 y.o. male with a hx of CAD s/p CABGx5 in 2009, bradycardia, mild-moderate AS, hyperlipidemia who is being seen 02/12/2024 for the evaluation of syncope/collaps   Assessment & Plan    Syncope: Reported syncopal episode where he fell and hit head after waking up at 2 AM to get drink of water.  Denied prodromal symptoms.  Noted to have transient A-V dissociation on telemetry. Echocardiogram shows normal biventricular function, moderate aortic stenosis -Seen by EP, episodes of AV block associated with sinus node slowing, suspect vasovagal syncope.  No indication for pacing at this time  CAD: Status post CABG with LIMA-LAD, SVG-D2, SVG-OM, SVG-RCA.  Denies any anginal symptoms.  Troponin mildly elevated and flat (29 > 38).  Suspect demand ischemia -Continue aspirin, rosuvastatin, Zetia  Aortic stenosis: Mild to moderate on echocardiogram 06/2023.  Echocardiogram this admission shows moderate aortic stenosis, continue outpatient monitoring  AKI: likely prerenal due to diarrhea.  Resolved with IVF  [facility] will sign off.   Medication Recommendations:  No changes Other recommendations (labs, testing, etc):  None Follow up as an outpatient:  F/u with EP scheduled for 03/26/24   For questions or updates, please  contact [facility] Please consult [email or web address] for contact info under        Signed, Little Ishikawa, MD  02/14/2024, 8:55 AM

## 2024-02-29 ENCOUNTER — Other Ambulatory Visit: Payer: Self-pay | Admitting: Cardiovascular Disease

## 2024-03-24 NOTE — Progress Notes (Unsigned)
 Cardiology Office Note:  .   Date:  03/24/2024  ID:  Carlos, [name] 1941-11-12, MRN [id number] PCP: Carlos Liberty, MD  [facility] Providers Cardiologist:  Carlos Schuller, MD {  History of Present Illness: Carlos [name] is a 83 y.o. male w/PMHx of  CAD (CABG 2009), VHD (mild-mod AS), HLD  He last saw Dr. Loetta [name] Nov 2024, walking 2-3 times per week for up to 2 miles each time last TTE July 02, 2023 which continued to show normal LV function with EF 60 to 65%, grade 1 diastolic dysfunction. There is mild to moderate aortic valve stenosis with a mean gradient of 14 mm and peak gradient of 24.6 mm    He was admitted 01/15/24 Reported a syncopal spell, after having gotten up in the night to get some water, on his way back fainted. Reported having been on the floor for about 15 minutes, though unsure how long he was unconscious for Wife reported felt a slow pulse once he was back in bed, called EMS >> he declined transport Yesterday morning another syncopal episode  yesterday morning during a BM/diarrhea Called PMD and was advised to go to the ER EP consulted with concerns of CHB on tele Seen by myself and Dr. Marven [name] He reported + fainting history  2-3 years ago had a broken arm, was in the waiting room, as it got more swollen and more painful, as he waited started to become very upset and got lightheaded and either fainted or nearly did   Many years ago, was in the ER when a friend sliced his foot at the beach, when he was watching them check/clean his friends wound he became near faint/very lightheaded Telemetry reviewed SB 50's I found 4 episodes of block/slowing All have sinus slowing associated with them All very short 2.4 and 2.8 second pauses longest associated with dropped beat One is without AV block > transient SB alone (2 beats into 35bpm or so No prolonged significant bradycardia or pausing  He was having ongoing issues with diarrhea in the hospital   In description of the events, symptoms via the patient/wife Felt "consistent with vasovagal syncope.  No evidence of significant conduction system disease.  He should avoid triggers.  He should maintain adequate hydration.  If he has any prodromal symptoms he should immediately get to the ground.  He should avoid driving for the next 4 to 6 weeks.  When he sees Carlos [name] in clinic, if remains stable without repeat syncopal episode, okay to return to driving. No permanent pacemaker is indicated at this time." GI panel + for Norovirus Discharged 02/14/24  Today's visit is scheduled as post hospital follow up ROS:   He is accompanied by his wife. He has done very well without recurrent symptoms They have been to the beach, he has been walking regularly generally 2 miles or ~ 3ominutes 2-3 times week with excellent exertional capacity. He has not had and dizzy spells, near syncope or syncope  No CP, palpitations or cardiac awareness No SOB, DOE  Studies Reviewed: Carlos [name]    EKG not done today   02/13/24: TTE 1. Left ventricular ejection fraction, by estimation, is 60 to 65%. The  left ventricle has normal function. The left ventricle has no regional  wall motion abnormalities. Left ventricular diastolic parameters are  consistent with Grade II diastolic  dysfunction (pseudonormalization).   2. Right ventricular systolic function is normal. The right ventricular  size is normal.  3. The mitral valve is normal in structure. No evidence of mitral valve  regurgitation. No evidence of mitral stenosis. Moderate mitral annular  calcification.   4. The aortic valve is normal in structure. There is moderate  calcification of the aortic valve. There is moderate thickening of the  aortic valve. Aortic valve regurgitation is not visualized. Moderate  aortic valve stenosis. Aortic valve area, by VTI  measures 1.88 cm. Aortic valve mean gradient measures 20.0 mmHg. Aortic  valve Vmax measures 2.81 m/s.    5. Aortic dilatation noted. There is mild dilatation of the aortic root,  measuring 40 mm.   6. The inferior vena cava is normal in size with greater than 50%  respiratory variability, suggesting right atrial pressure of 3 mmHg.      ECHO: 07/02/2023  1. Left ventricular ejection fraction, by estimation, is 60 to 65%. The  left ventricle has normal function. The left ventricle has no regional  wall motion abnormalities. There is mild left ventricular hypertrophy.  Left ventricular diastolic parameters  are consistent with Grade I diastolic dysfunction (impaired relaxation).  The average left ventricular global longitudinal strain is -20.3 %. The  global longitudinal strain is normal.   2. Right ventricular systolic function is normal. The right ventricular  size is normal.   3. The mitral valve is normal in structure. No evidence of mitral valve  regurgitation. No evidence of mitral stenosis. Moderate mitral annular  calcification.   4. The aortic valve is tricuspid. There is moderate calcification of the  aortic valve. There is moderate thickening of the aortic valve. Aortic  valve regurgitation is not visualized. Mild to moderate aortic valve  stenosis. Aortic valve mean gradient  measures 14.0 mmHg. Aortic valve Vmax measures 2.48 m/s.   5. The inferior vena cava is normal in size with greater than 50%  respiratory variability, suggesting right atrial pressure of 3 mmHg.      Risk Assessment/Calculations:    Physical Exam:   VS:  There were no vitals taken for this visit.   Wt Readings from Last 3 Encounters:  02/14/24 185 lb 8 oz (84.1 kg)  10/03/23 189 lb 3.2 oz (85.8 kg)  08/21/22 190 lb (86.2 kg)    GEN: Well nourished, well developed in no acute distress NECK: No JVD; No carotid bruits CARDIAC: RRR, 2/6 SM, rubs, gallops RESPIRATORY:  CTA b/l without rales, wheezing or rhonchi  ABDOMEN: Soft, non-tender, non-distended EXTREMITIES: No edema; No deformity    ASSESSMENT AND PLAN: .    Syncope Felt to have been vagal with hx of the same We revisited safety, avoiding triggers, and keeping adequatly hydrated  He has not had any recurrent symptoms and at this juncture, planned to clear him to resume driving if doing well  I don't think he needs further EP follow up  2. CAD No symptoms  3. VHD Mod AS  He reports Dr. Loetta [name] recommended that he transition to Dr. Arlester Ladd with his upcoming retirement  Dispo: will have him see Dr. Awanda Bogaert in 4 mo, sooner if needed  Signed, Debbie Fails, PA-C

## 2024-03-26 ENCOUNTER — Ambulatory Visit: Attending: Physician Assistant | Admitting: Physician Assistant

## 2024-03-26 ENCOUNTER — Encounter: Payer: Self-pay | Admitting: Physician Assistant

## 2024-03-26 VITALS — BP 114/66 | HR 56 | Ht 68.0 in | Wt 191.3 lb

## 2024-03-26 DIAGNOSIS — I350 Nonrheumatic aortic (valve) stenosis: Secondary | ICD-10-CM | POA: Diagnosis not present

## 2024-03-26 DIAGNOSIS — I2510 Atherosclerotic heart disease of native coronary artery without angina pectoris: Secondary | ICD-10-CM | POA: Diagnosis not present

## 2024-03-26 DIAGNOSIS — R55 Syncope and collapse: Secondary | ICD-10-CM

## 2024-03-26 NOTE — Patient Instructions (Signed)
 Medication Instructions:   Your physician recommends that you continue on your current medications as directed. Please refer to the Current Medication list given to you today.  *If you need a refill on your cardiac medications before your next appointment, please call your pharmacy*   Lab Work:  NONE ORDERED  TODAY    If you have labs (blood work) drawn today and your tests are completely normal, you will receive your results only by: MyChart Message (if you have MyChart) OR A paper copy in the mail If you have any lab test that is abnormal or we need to change your treatment, we will call you to review the results.  Testing/Procedures: NONE ORDERED  TODAY    Follow-Up: At [facility], you and your health needs are our priority.  As part of our continuing mission to provide you with exceptional heart care, our providers are all part of one team.  This team includes your primary Cardiologist (physician) and Advanced Practice Providers or APPs (Physician Assistants and Nurse Practitioners) who all work together to provide you with the care you need, when you need it.  Your next appointment:   4 month(s)   Provider:  DR Arlester Ladd / APP   We recommend signing up for the patient portal called "MyChart".  Sign up information is provided on this After Visit Summary.  MyChart is used to connect with patients for Virtual Visits (Telemedicine).  Patients are able to view lab/test results, encounter notes, upcoming appointments, etc.  Non-urgent messages can be sent to your provider as well.   To learn more about what you can do with MyChart, go to [email or web address].   Other Instructions

## 2024-09-30 ENCOUNTER — Ambulatory Visit (HOSPITAL_COMMUNITY)
Admission: RE | Admit: 2024-09-30 | Discharge: 2024-09-30 | Disposition: A | Discharge status: Home / Self Care | Payer: Medicare Other | Source: Ambulatory Visit | Attending: Internal Medicine | Admitting: Internal Medicine

## 2024-09-30 DIAGNOSIS — I350 Nonrheumatic aortic (valve) stenosis: Secondary | ICD-10-CM | POA: Diagnosis present

## 2024-09-30 LAB — ECHOCARDIOGRAM COMPLETE
AR max vel: 1.36 cm2
AV Area VTI: 1.45 cm2
AV Area mean vel: 1.3 cm2
AV Mean grad: 20.3 mmHg
AV Peak grad: 29.9 mmHg
Ao pk vel: 2.73 m/s
S' Lateral: 2.4 cm

## 2024-10-04 ENCOUNTER — Ambulatory Visit: Payer: Self-pay | Admitting: Cardiovascular Disease

## 2024-10-05 ENCOUNTER — Ambulatory Visit: Attending: Cardiovascular Disease | Admitting: Cardiovascular Disease

## 2024-10-05 ENCOUNTER — Encounter: Payer: Self-pay | Admitting: Cardiovascular Disease

## 2024-10-05 VITALS — BP 126/80 | HR 59 | Ht 68.0 in | Wt 189.6 lb

## 2024-10-05 DIAGNOSIS — E782 Mixed hyperlipidemia: Secondary | ICD-10-CM | POA: Diagnosis not present

## 2024-10-05 DIAGNOSIS — R55 Syncope and collapse: Secondary | ICD-10-CM | POA: Diagnosis not present

## 2024-10-05 DIAGNOSIS — I350 Nonrheumatic aortic (valve) stenosis: Secondary | ICD-10-CM

## 2024-10-05 DIAGNOSIS — I2510 Atherosclerotic heart disease of native coronary artery without angina pectoris: Secondary | ICD-10-CM

## 2024-10-05 NOTE — Patient Instructions (Signed)
 Medication Instructions:  Your physician recommends that you continue on your current medications as directed. Please refer to the Current Medication list given to you today.  *If you need a refill on your cardiac medications before your next appointment, please call your pharmacy*  Testing/Procedures: Echocardiogram - in one year (prior to your office visit) Your physician has requested that you have an echocardiogram. Echocardiography is a painless test that uses sound waves to create images of your heart. It provides your doctor with information about the size and shape of your heart and how well your heart's chambers and valves are working. This procedure takes approximately one hour. There are no restrictions for this procedure. Please do NOT wear cologne, perfume, aftershave, or lotions (deodorant is allowed). Please arrive 15 minutes prior to your appointment time.  Please note: We ask at that you not bring children with you during ultrasound (echo/ vascular) testing. Due to room size and safety concerns, children are not allowed in the ultrasound rooms during exams. Our front office staff cannot provide observation of children in our lobby area while testing is being conducted. An adult accompanying a patient to their appointment will only be allowed in the ultrasound room at the discretion of the ultrasound technician under special circumstances. We apologize for any inconvenience.  Follow-Up: At [facility], you and your health needs are our priority.  As part of our continuing mission to provide you with exceptional heart care, our providers are all part of one team.  This team includes your primary Cardiologist (physician) and Advanced Practice Providers or APPs (Physician Assistants and Nurse Practitioners) who all work together to provide you with the care you need, when you need it.  Your next appointment:   1 year  Provider:   Ozell Fell, MD

## 2024-10-05 NOTE — Progress Notes (Signed)
 Cardiology Office Note:    Date:  10/05/2024   ID:  Carlos [name], Carlos [name] 01/04/1941, MRN [id number]  PCP:  Larnell Hamilton, MD    [facility] Providers Cardiologist:  Ozell Fell, MD     Referring MD: Larnell Hamilton, MD   Chief Complaint  Patient presents with   Follow-up    Aortic valve disease    History of Present Illness:    Carlos [name] is a 83 y.o. male with a hx of coronary artery disease, presenting for follow-up evaluation.  The patient has previously been followed by Dr. Burnard and I will be assuming his cardiac care and Dr. Joesphine retirement.  The patient underwent 5 vessel CABG in 2009 after presenting with non-STEMI.  He was treated with LIMA to LAD, SVG to D2, sequential SVG to OM1 and OM 2, and SVG to PDA.  LV function has been normal.  He has developed mild to moderate aortic stenosis.  He has a history of vasovagal syncope.  The patient had a recent echocardiogram 09/30/2024 showing LVEF 55 to 60%, normal RV function, no mitral valve disease other than mitral annular calcification, and evidence of a high flow state with moderate aortic valve stenosis, AVA 1.45 cm, mean gradient 20 mmHg.  The patient is here alone today. He is doing well and reports that he walked 2 miles yesterday with no limitation. He walks as close to 4 mph as he can. Today, he denies symptoms of palpitations, chest pain, shortness of breath, orthopnea, PND, lower extremity edema, or syncope. He has occasional lightheadedness.  He had an episode of syncope this spring that occurred when he had norovirus.  He was evaluated by cardiology due to bradycardia and a brief episode of A-V dissociation.  EP consultation is reviewed and they felt he had a vasovagal episode with no indication for permanent pacemaker implant.  He has had no further problems.    Current Medications: Current Meds  Medication Sig   aspirin  81 MG tablet Take 81 mg by mouth daily.   Coenzyme Q10 (COQ10) 100 MG  CAPS    ezetimibe  (ZETIA ) 10 MG tablet TAKE 1 TABLET BY MOUTH EVERY DAY   Glucosamine-Chondroit-Vit C-Mn (GLUCOSAMINE-CHONDROITIN MAX ST PO)    Multiple Vitamin (MULTIVITAMIN WITH MINERALS) TABS tablet Take 1 tablet by mouth daily.   rosuvastatin  (CRESTOR ) 40 MG tablet TAKE 1 TABLET BY MOUTH EVERY DAY     Allergies:   Nebivolol , Other, and Pollen extract   ROS:   Please see the history of present illness.    All other systems reviewed and are negative.  EKGs/Labs/Other Studies Reviewed:    The following studies were reviewed today: Cardiac Studies & Procedures   ______________________________________________________________________________________________   STRESS TESTS  MYOCARDIAL PERFUSION IMAGING 02/16/2016  Interpretation Summary  Nuclear stress EF: 55%.  No T wave inversion was noted during stress.  There was no ST segment deviation noted during stress.  This is a low risk study.  Defect 1: There is a small defect of mild severity.  Small, mild fixed apical and apical lateral perfusion defect, likely artifact. No reversible ischemia. LVEF 55% with normal wall motion. This is a low risk study.   ECHOCARDIOGRAM  ECHOCARDIOGRAM COMPLETE 09/30/2024  Narrative ECHOCARDIOGRAM REPORT    Patient Name:   Carlos [name] [facility] Date of Exam: 09/30/2024 Medical Rec #:  [id number]        Height:       68.0 in Accession #:    [id number]  Weight:       191.3 lb Date of Birth:  Nov 21, 1940        BSA:          2.005 m Patient Age:    83 years         BP:           114/66 mmHg Patient Gender: M                HR:           55 bpm. Exam Location:  [address]  Procedure: 2D Echo, Cardiac Doppler, Color Doppler, 3D Echo and Strain Analysis (Both Spectral and Color Flow Doppler were utilized during procedure).  Indications:    Aortic stenosis I35.0  History:        Patient has prior history of Echocardiogram examinations, most recent 02/13/2024. CAD; Prior  CABG.  Sonographer:    Waldo Guadalajara RCS Referring Phys: [phone] THOMAS A KELLY  IMPRESSIONS   1. Left ventricular ejection fraction, by estimation, is 55 to 60%. Left ventricular ejection fraction by 3D volume is 54 %. The left ventricle has normal function. The left ventricle has no regional wall motion abnormalities. The average left ventricular global longitudinal strain is -20.0 %. The global longitudinal strain is normal. 2. Right ventricular systolic function is normal. The right ventricular size is normal. 3. The mitral valve is normal in structure. No evidence of mitral valve regurgitation. No evidence of mitral stenosis. Severe mitral annular calcification. 4. Evidence of high flow state with LVOT VTI 41. The aortic valve is calcified. There is severe calcifcation of the aortic valve. Aortic valve regurgitation is not visualized. Moderate aortic valve stenosis. Aortic valve area, by VTI measures 1.45 cm. Aortic valve mean gradient measures 20.3 mmHg. Aortic valve Vmax measures 2.73 m/s. 5. The inferior vena cava is normal in size with greater than 50% respiratory variability, suggesting right atrial pressure of 3 mmHg.  Comparison(s): Changes from prior study are noted. Higher gradient across aortic valve, though could be in setting of high flow state.  FINDINGS Left Ventricle: Left ventricular ejection fraction, by estimation, is 55 to 60%. Left ventricular ejection fraction by 3D volume is 54 %. The left ventricle has normal function. The left ventricle has no regional wall motion abnormalities. The average left ventricular global longitudinal strain is -20.0 %. Strain was performed and the global longitudinal strain is normal. The left ventricular internal cavity size was normal in size. There is no left ventricular hypertrophy. Left ventricular diastolic function could not be evaluated due to mitral annular calcification (moderate or greater).  Right Ventricle: The right  ventricular size is normal. No increase in right ventricular wall thickness. Right ventricular systolic function is normal.  Left Atrium: Left atrial size was normal in size.  Right Atrium: Right atrial size was normal in size.  Pericardium: There is no evidence of pericardial effusion.  Mitral Valve: The mitral valve is normal in structure. Severe mitral annular calcification. No evidence of mitral valve regurgitation. No evidence of mitral valve stenosis.  Tricuspid Valve: The tricuspid valve is normal in structure. Tricuspid valve regurgitation is not demonstrated. No evidence of tricuspid stenosis.  Aortic Valve: Evidence of high flow state with LVOT VTI 41. The aortic valve is calcified. There is severe calcifcation of the aortic valve. Aortic valve regurgitation is not visualized. Moderate aortic stenosis is present. Aortic valve mean gradient measures 20.3 mmHg. Aortic valve peak gradient measures 29.9 mmHg. Aortic valve area, by VTI  measures 1.45 cm.  Pulmonic Valve: The pulmonic valve was normal in structure. Pulmonic valve regurgitation is not visualized. No evidence of pulmonic stenosis.  Aorta: The aortic root is normal in size and structure.  Venous: The inferior vena cava is normal in size with greater than 50% respiratory variability, suggesting right atrial pressure of 3 mmHg.  IAS/Shunts: No atrial level shunt detected by color flow Doppler.  Additional Comments: 3D was performed not requiring image post processing on an independent workstation and was normal.   LEFT VENTRICLE PLAX 2D LVIDd:         3.40 cm         Diastology LVIDs:         2.40 cm         LV e' medial:    5.44 cm/s LV PW:         1.10 cm         LV E/e' medial:  21.0 LV IVS:        1.30 cm         LV e' lateral:   7.94 cm/s LVOT diam:     1.80 cm         LV E/e' lateral: 14.4 LV SV:         108 LV SV Index:   54              2D Longitudinal LVOT Area:     2.54 cm        Strain LV IVRT:        102 msec        2D Strain GLS   -17.4 % (A4C): 2D Strain GLS   -16.4 % (A3C): 2D Strain GLS   -26.2 % (A2C): 2D Strain GLS   -20.0 % Avg:  3D Volume EF LV 3D EF:    Left ventricul ar ejection fraction by 3D volume is 54 %.  3D Volume EF: 3D EF:        54 % LV EDV:       101 ml LV ESV:       46 ml LV SV:        55 ml  RIGHT VENTRICLE RV Basal diam:  2.70 cm    PULMONARY VEINS RV S prime:     9.90 cm/s  A Reversal Velocity: 21.20 cm/s TAPSE (M-mode): 2.0 cm     Diastolic Velocity:  22.10 cm/s S/D Velocity:        2.40 Systolic Velocity:   53.90 cm/s  LEFT ATRIUM             Index        RIGHT ATRIUM           Index LA diam:        4.30 cm 2.14 cm/m   RA Area:     10.90 cm LA Vol (A2C):   61.6 ml 30.72 ml/m  RA Volume:   20.50 ml  10.22 ml/m LA Vol (A4C):   55.5 ml 27.67 ml/m LA Biplane Vol: 58.6 ml 29.22 ml/m AORTIC VALVE AV Area (Vmax):    1.36 cm AV Area (Vmean):   1.30 cm AV Area (VTI):     1.45 cm AV Vmax:           273.33 cm/s AV Vmean:          217.667 cm/s AV VTI:            0.747 m AV Peak Grad:  29.9 mmHg AV Mean Grad:      20.3 mmHg LVOT Vmax:         146.00 cm/s LVOT Vmean:        111.500 cm/s LVOT VTI:          0.426 m LVOT/AV VTI ratio: 0.57  AORTA Ao Root diam: 3.40 cm Ao Asc diam:  3.50 cm  MV E velocity: 114.00 cm/s MV A velocity: 126.00 cm/s  SHUNTS MV E/A ratio:  0.90         Systemic VTI:  0.43 m Systemic Diam: 1.80 cm  Franck Azobou Tonleu Electronically signed by Joelle Cedars Tonleu Signature Date/Time: 09/30/2024/11:28:42 AM    Final          ______________________________________________________________________________________________      EKG:        Recent Labs: 02/13/2024: ALT 24; Magnesium 1.7; TSH 0.558 02/14/2024: BUN 12; Creatinine, Ser 1.02; Hemoglobin 12.8; Platelets 120; Potassium 3.7; Sodium 139  Recent Lipid Panel    Component Value Date/Time   CHOL 122 07/25/2018 0822   TRIG 59  07/25/2018 0822   HDL 49 07/25/2018 0822   CHOLHDL 2.5 07/25/2018 0822   CHOLHDL 3.0 02/19/2017 0857   VLDL 14 02/19/2017 0857   LDLCALC 61 07/25/2018 0822     Risk Assessment/Calculations:                Physical Exam:    VS:  BP 126/80 (BP Location: Left Arm, Patient Position: Sitting, Cuff Size: Normal)   Pulse (!) 59   Ht 5' 8 (1.727 m)   Wt 189 lb 9.6 oz (86 kg)   SpO2 96%   BMI 28.83 kg/m     Wt Readings from Last 3 Encounters:  10/05/24 189 lb 9.6 oz (86 kg)  03/26/24 191 lb 4.8 oz (86.8 kg)  02/14/24 185 lb 8 oz (84.1 kg)     GEN:  Well nourished, well developed in no acute distress HEENT: Normal NECK: No JVD; No carotid bruits LYMPHATICS: No lymphadenopathy CARDIAC: RRR, 3/6 systolic murmur at the RUSB RESPIRATORY:  Clear to auscultation without rales, wheezing or rhonchi  ABDOMEN: Soft, non-tender, non-distended MUSCULOSKELETAL:  No edema; No deformity  SKIN: Warm and dry NEUROLOGIC:  Alert and oriented x 3 PSYCHIATRIC:  Normal affect   Assessment & Plan Nonrheumatic aortic valve stenosis Recent echo reviewed with preserved LV EF and moderate aortic stenosis with valve area 1.45 cm and mean gradient 20 mmHg.  Repeat echo 1 year.  Discussed natural history of aortic stenosis with the patient and cardinal symptoms to keep an eye out, including exertional dyspnea, fatigue, lightheadedness/syncope, and chest discomfort. Coronary artery disease involving native coronary artery of native heart without angina pectoris Doing well on aspirin  and a high intensity statin drug.  No anginal symptoms.  Continue current management. Mixed hyperlipidemia Treated with ezetimibe  and rosuvastatin .  Cholesterol 109, LDL 59, HDL 32, triglycerides 92.  Continue current management. Vasovagal syncope No recurrent issues.  Discussed the importance of fluid hydration and sodium intake at times of illness.     Medication Adjustments/Labs and Tests Ordered: Current medicines  are reviewed at length with the patient today.  Concerns regarding medicines are outlined above.  Orders Placed This Encounter  Procedures   ECHOCARDIOGRAM COMPLETE   No orders of the defined types were placed in this encounter.   Patient Instructions  Medication Instructions:  Your physician recommends that you continue on your current medications as directed. Please refer to the Current Medication list  given to you today.  *If you need a refill on your cardiac medications before your next appointment, please call your pharmacy*  Testing/Procedures: Echocardiogram - in one year (prior to your office visit) Your physician has requested that you have an echocardiogram. Echocardiography is a painless test that uses sound waves to create images of your heart. It provides your doctor with information about the size and shape of your heart and how well your heart's chambers and valves are working. This procedure takes approximately one hour. There are no restrictions for this procedure. Please do NOT wear cologne, perfume, aftershave, or lotions (deodorant is allowed). Please arrive 15 minutes prior to your appointment time.  Please note: We ask at that you not bring children with you during ultrasound (echo/ vascular) testing. Due to room size and safety concerns, children are not allowed in the ultrasound rooms during exams. Our front office staff cannot provide observation of children in our lobby area while testing is being conducted. An adult accompanying a patient to their appointment will only be allowed in the ultrasound room at the discretion of the ultrasound technician under special circumstances. We apologize for any inconvenience.  Follow-Up: At [facility], you and your health needs are our priority.  As part of our continuing mission to provide you with exceptional heart care, our providers are all part of one team.  This team includes your primary Cardiologist (physician)  and Advanced Practice Providers or APPs (Physician Assistants and Nurse Practitioners) who all work together to provide you with the care you need, when you need it.  Your next appointment:   1 year  Provider:   Ozell Fell, MD             Signed, Ozell Fell, MD  10/05/2024 12:04 PM    [facility]

## 2024-10-05 NOTE — Assessment & Plan Note (Signed)
 Recent echo reviewed with preserved LV EF and moderate aortic stenosis with valve area 1.45 cm and mean gradient 20 mmHg.  Repeat echo 1 year.  Discussed natural history of aortic stenosis with the patient and cardinal symptoms to keep an eye out, including exertional dyspnea, fatigue, lightheadedness/syncope, and chest discomfort.

## 2024-10-05 NOTE — Assessment & Plan Note (Signed)
 Doing well on aspirin  and a high intensity statin drug.  No anginal symptoms.  Continue current management.

## 2024-10-05 NOTE — Assessment & Plan Note (Signed)
 No recurrent issues.  Discussed the importance of fluid hydration and sodium intake at times of illness.

## 2024-11-18 ENCOUNTER — Other Ambulatory Visit: Payer: Self-pay | Admitting: Cardiovascular Disease

## 2024-11-18 MED ORDER — ROSUVASTATIN CALCIUM 40 MG PO TABS: 40.0000 mg | ORAL_TABLET | Freq: Every day | ORAL | 3 refills | Status: AC

## 2024-11-30 ENCOUNTER — Other Ambulatory Visit: Payer: Self-pay

## 2024-11-30 ENCOUNTER — Telehealth: Payer: Self-pay | Admitting: Cardiovascular Disease

## 2024-11-30 MED ORDER — EZETIMIBE 10 MG PO TABS: 10.0000 mg | ORAL_TABLET | Freq: Every day | ORAL | 3 refills | Status: AC

## 2024-11-30 NOTE — Telephone Encounter (Signed)
" °*  STAT* If patient is at the pharmacy, call can be transferred to refill team.   1. Which medications need to be refilled? (please list name of each medication and dose if known)   ezetimibe  (ZETIA ) 10 MG tablet   2. Would you like to learn more about the convenience, safety, & potential cost savings by using the [facility]? no  3. Are you open to using the [facility] (Type [facility]. no   4. Which pharmacy/location (including street and city if local pharmacy) is medication to be sent to?  [id number] - [place], [place] - [address]    5. Do they need a 30 day or 90 day supply?  90 day "

## 2024-12-01 NOTE — Telephone Encounter (Signed)
 Refill was sent 11/30/24.

## 2025-10-04 ENCOUNTER — Ambulatory Visit (HOSPITAL_COMMUNITY)
